# Patient Record
Sex: Female | Born: 1965 | Race: Black or African American | Hispanic: No | Marital: Married | Smoking: Current every day smoker
Health system: Southern US, Community
[De-identification: ages and names within clinical notes are randomized; demographics above are authoritative.]

## PROBLEM LIST (undated history)

## (undated) DIAGNOSIS — I10 Essential (primary) hypertension: Secondary | ICD-10-CM

## (undated) DIAGNOSIS — F329 Major depressive disorder, single episode, unspecified: Secondary | ICD-10-CM

## (undated) DIAGNOSIS — F419 Anxiety disorder, unspecified: Secondary | ICD-10-CM

## (undated) DIAGNOSIS — F32A Depression, unspecified: Secondary | ICD-10-CM

## (undated) HISTORY — DX: Major depressive disorder, single episode, unspecified: F32.9

## (undated) HISTORY — DX: Essential (primary) hypertension: I10

## (undated) HISTORY — DX: Depression, unspecified: F32.A

---

## 2001-04-16 HISTORY — PX: FRACTURE SURGERY: SHX138

## 2001-10-14 ENCOUNTER — Encounter: Payer: Self-pay | Admitting: Emergency Medicine

## 2001-10-14 ENCOUNTER — Emergency Department (HOSPITAL_COMMUNITY): Admission: EM | Admit: 2001-10-14 | Discharge: 2001-10-14 | Payer: Self-pay | Admitting: Emergency Medicine

## 2001-10-15 ENCOUNTER — Encounter: Admission: RE | Admit: 2001-10-15 | Discharge: 2001-10-15 | Payer: Self-pay | Admitting: Internal Medicine

## 2001-10-22 ENCOUNTER — Encounter: Admission: RE | Admit: 2001-10-22 | Discharge: 2001-10-22 | Payer: Self-pay | Admitting: Internal Medicine

## 2001-11-12 ENCOUNTER — Encounter: Admission: RE | Admit: 2001-11-12 | Discharge: 2001-11-12 | Payer: Self-pay | Admitting: Internal Medicine

## 2002-07-06 ENCOUNTER — Emergency Department (HOSPITAL_COMMUNITY): Admission: EM | Admit: 2002-07-06 | Discharge: 2002-07-06 | Payer: Self-pay | Admitting: *Deleted

## 2004-01-11 ENCOUNTER — Emergency Department (HOSPITAL_COMMUNITY): Admission: EM | Admit: 2004-01-11 | Discharge: 2004-01-11 | Payer: Self-pay | Admitting: Emergency Medicine

## 2004-10-09 ENCOUNTER — Emergency Department (HOSPITAL_COMMUNITY): Admission: EM | Admit: 2004-10-09 | Discharge: 2004-10-09 | Payer: Self-pay | Admitting: Emergency Medicine

## 2005-01-18 ENCOUNTER — Emergency Department (HOSPITAL_COMMUNITY): Admission: EM | Admit: 2005-01-18 | Discharge: 2005-01-18 | Payer: Self-pay | Admitting: Emergency Medicine

## 2005-03-03 ENCOUNTER — Emergency Department (HOSPITAL_COMMUNITY): Admission: EM | Admit: 2005-03-03 | Discharge: 2005-03-03 | Payer: Self-pay | Admitting: Emergency Medicine

## 2005-04-15 ENCOUNTER — Emergency Department (HOSPITAL_COMMUNITY): Admission: EM | Admit: 2005-04-15 | Discharge: 2005-04-15 | Payer: Self-pay | Admitting: Emergency Medicine

## 2006-04-14 ENCOUNTER — Emergency Department (HOSPITAL_COMMUNITY): Admission: EM | Admit: 2006-04-14 | Discharge: 2006-04-14 | Payer: Self-pay | Admitting: Emergency Medicine

## 2006-07-14 ENCOUNTER — Emergency Department (HOSPITAL_COMMUNITY): Admission: EM | Admit: 2006-07-14 | Discharge: 2006-07-14 | Payer: Self-pay | Admitting: Emergency Medicine

## 2007-01-01 ENCOUNTER — Emergency Department (HOSPITAL_COMMUNITY): Admission: EM | Admit: 2007-01-01 | Discharge: 2007-01-01 | Payer: Self-pay | Admitting: Emergency Medicine

## 2007-01-03 ENCOUNTER — Encounter (INDEPENDENT_AMBULATORY_CARE_PROVIDER_SITE_OTHER): Payer: Self-pay | Admitting: Internal Medicine

## 2007-01-08 ENCOUNTER — Emergency Department (HOSPITAL_COMMUNITY): Admission: EM | Admit: 2007-01-08 | Discharge: 2007-01-08 | Payer: Self-pay | Admitting: Emergency Medicine

## 2007-02-18 ENCOUNTER — Encounter: Admission: RE | Admit: 2007-02-18 | Discharge: 2007-02-18 | Payer: Self-pay | Admitting: *Deleted

## 2007-04-24 ENCOUNTER — Ambulatory Visit: Payer: Self-pay | Admitting: Internal Medicine

## 2007-04-24 DIAGNOSIS — IMO0002 Reserved for concepts with insufficient information to code with codable children: Secondary | ICD-10-CM

## 2007-04-24 DIAGNOSIS — R55 Syncope and collapse: Secondary | ICD-10-CM

## 2007-04-24 DIAGNOSIS — F1910 Other psychoactive substance abuse, uncomplicated: Secondary | ICD-10-CM

## 2007-04-24 DIAGNOSIS — F329 Major depressive disorder, single episode, unspecified: Secondary | ICD-10-CM

## 2007-04-24 DIAGNOSIS — F410 Panic disorder [episodic paroxysmal anxiety] without agoraphobia: Secondary | ICD-10-CM

## 2007-04-25 ENCOUNTER — Encounter (INDEPENDENT_AMBULATORY_CARE_PROVIDER_SITE_OTHER): Payer: Self-pay | Admitting: Internal Medicine

## 2007-05-15 ENCOUNTER — Ambulatory Visit: Payer: Self-pay | Admitting: Internal Medicine

## 2007-05-23 ENCOUNTER — Ambulatory Visit: Payer: Self-pay | Admitting: Internal Medicine

## 2007-05-23 ENCOUNTER — Ambulatory Visit (HOSPITAL_COMMUNITY): Admission: RE | Admit: 2007-05-23 | Discharge: 2007-05-23 | Payer: Self-pay | Admitting: Internal Medicine

## 2007-08-06 ENCOUNTER — Telehealth (INDEPENDENT_AMBULATORY_CARE_PROVIDER_SITE_OTHER): Payer: Self-pay | Admitting: Internal Medicine

## 2007-09-21 ENCOUNTER — Emergency Department (HOSPITAL_COMMUNITY): Admission: EM | Admit: 2007-09-21 | Discharge: 2007-09-21 | Payer: Self-pay | Admitting: Emergency Medicine

## 2007-10-28 ENCOUNTER — Telehealth (INDEPENDENT_AMBULATORY_CARE_PROVIDER_SITE_OTHER): Payer: Self-pay | Admitting: Internal Medicine

## 2007-12-04 ENCOUNTER — Ambulatory Visit: Payer: Self-pay | Admitting: Internal Medicine

## 2008-01-20 ENCOUNTER — Ambulatory Visit: Payer: Self-pay | Admitting: Internal Medicine

## 2008-07-06 ENCOUNTER — Encounter (INDEPENDENT_AMBULATORY_CARE_PROVIDER_SITE_OTHER): Payer: Self-pay | Admitting: Internal Medicine

## 2008-07-06 ENCOUNTER — Ambulatory Visit: Payer: Self-pay | Admitting: Internal Medicine

## 2008-07-06 DIAGNOSIS — A5901 Trichomonal vulvovaginitis: Secondary | ICD-10-CM | POA: Insufficient documentation

## 2008-07-06 DIAGNOSIS — F5089 Other specified eating disorder: Secondary | ICD-10-CM | POA: Insufficient documentation

## 2008-07-06 LAB — CONVERTED CEMR LAB
Bilirubin Urine: NEGATIVE
Blood in Urine, dipstick: NEGATIVE
Glucose, Urine, Semiquant: NEGATIVE
KOH Prep: NEGATIVE
Ketones, urine, test strip: NEGATIVE
Nitrite: NEGATIVE
Protein, U semiquant: NEGATIVE
Specific Gravity, Urine: <1.005
Urobilinogen, UA: 0.2
Whiff Test: POSITIVE
pH: 6.5

## 2008-07-08 ENCOUNTER — Ambulatory Visit (HOSPITAL_COMMUNITY): Admission: RE | Admit: 2008-07-08 | Discharge: 2008-07-08 | Payer: Self-pay | Admitting: Internal Medicine

## 2008-07-09 ENCOUNTER — Encounter (INDEPENDENT_AMBULATORY_CARE_PROVIDER_SITE_OTHER): Payer: Self-pay | Admitting: Internal Medicine

## 2008-07-09 LAB — CONVERTED CEMR LAB
ALT: 13 units/L (ref 0–35)
AST: 14 units/L (ref 0–37)
Albumin: 4.1 g/dL (ref 3.5–5.2)
Alkaline Phosphatase: 80 units/L (ref 39–117)
BUN: 4 mg/dL — ABNORMAL LOW (ref 6–23)
Basophils Absolute: 0.1 10*3/uL (ref 0.0–0.1)
Basophils Relative: 1 % (ref 0–1)
CO2: 26 meq/L (ref 19–32)
Calcium: 9.7 mg/dL (ref 8.4–10.5)
Chlamydia, DNA Probe: NEGATIVE
Chloride: 102 meq/L (ref 96–112)
Cholesterol: 137 mg/dL (ref 0–200)
Creatinine, Ser: 0.83 mg/dL (ref 0.40–1.20)
Eosinophils Absolute: 0.1 10*3/uL (ref 0.0–0.7)
Eosinophils Relative: 2 % (ref 0–5)
GC Probe Amp, Genital: NEGATIVE
Glucose, Bld: 86 mg/dL (ref 70–99)
HCT: 36.2 % (ref 36.0–46.0)
HDL: 52 mg/dL (ref >39)
Hemoglobin: 11.3 g/dL — ABNORMAL LOW (ref 12.0–15.0)
LDL Cholesterol: 66 mg/dL (ref 0–99)
Lymphocytes Relative: 38 % (ref 12–46)
Lymphs Abs: 2.9 10*3/uL (ref 0.7–4.0)
MCHC: 31.2 g/dL (ref 30.0–36.0)
MCV: 73.1 fL — ABNORMAL LOW (ref 78.0–100.0)
Monocytes Absolute: 0.6 10*3/uL (ref 0.1–1.0)
Monocytes Relative: 7 % (ref 3–12)
Neutro Abs: 3.9 10*3/uL (ref 1.7–7.7)
Neutrophils Relative %: 52 % (ref 43–77)
Platelets: 359 10*3/uL (ref 150–400)
Potassium: 4.4 meq/L (ref 3.5–5.3)
RBC: 4.95 M/uL (ref 3.87–5.11)
RDW: 17 % — ABNORMAL HIGH (ref 11.5–15.5)
Sodium: 138 meq/L (ref 135–145)
Total Bilirubin: 0.3 mg/dL (ref 0.3–1.2)
Total CHOL/HDL Ratio: 2.6
Total Protein: 7.3 g/dL (ref 6.0–8.3)
Triglycerides: 96 mg/dL (ref <150)
VLDL: 19 mg/dL (ref 0–40)
WBC: 7.5 10*3/uL (ref 4.0–10.5)

## 2008-07-12 ENCOUNTER — Ambulatory Visit: Payer: Self-pay | Admitting: Internal Medicine

## 2008-07-12 LAB — CONVERTED CEMR LAB: RBC Folate: 478 ng/mL (ref 180–600)

## 2008-07-14 DIAGNOSIS — D509 Iron deficiency anemia, unspecified: Secondary | ICD-10-CM

## 2008-07-14 LAB — CONVERTED CEMR LAB
Iron: 22 ug/dL — ABNORMAL LOW (ref 42–145)
Lead-Whole Blood: 2.6 ug/dL
Saturation Ratios: 4 % — ABNORMAL LOW (ref 20–55)
TIBC: 499 ug/dL — ABNORMAL HIGH (ref 250–470)
UIBC: 477 ug/dL
Vitamin B-12: 775 pg/mL (ref 211–911)

## 2008-07-22 ENCOUNTER — Emergency Department (HOSPITAL_COMMUNITY): Admission: EM | Admit: 2008-07-22 | Discharge: 2008-07-22 | Payer: Self-pay | Admitting: Emergency Medicine

## 2008-09-07 ENCOUNTER — Ambulatory Visit: Payer: Self-pay | Admitting: *Deleted

## 2008-09-24 IMAGING — US US ABDOMEN COMPLETE
1 series · 14 of 25 positions shown · non-contrast
Comparison: none

CLINICAL DATA: 40-year-old with vomiting and chest pain.  
 ABDOMEN ULTRASOUND:
TECHNIQUE: Complete abdominal ultrasound examination was performed including evaluation of the liver, gallbladder, bile ducts, pancreas, kidneys, spleen, IVC, and abdominal aorta.

[Series 1: unknown · 0.35mm/px · 14 of 69 slices shown]
[im 1/69]
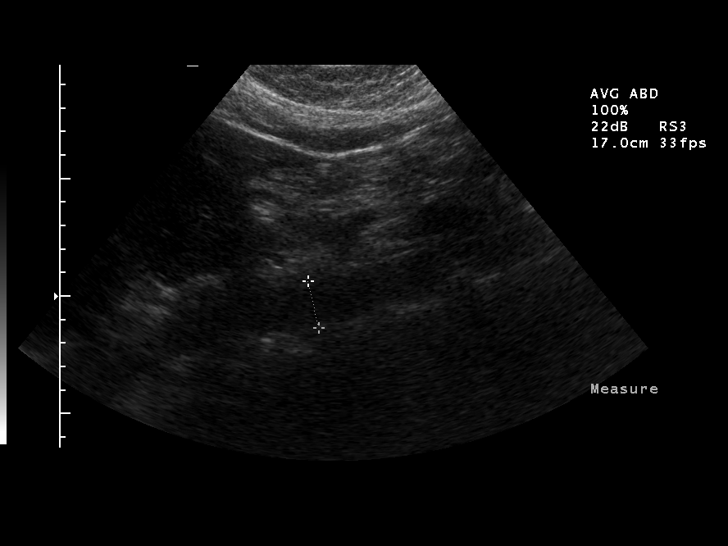
[im 6/69]
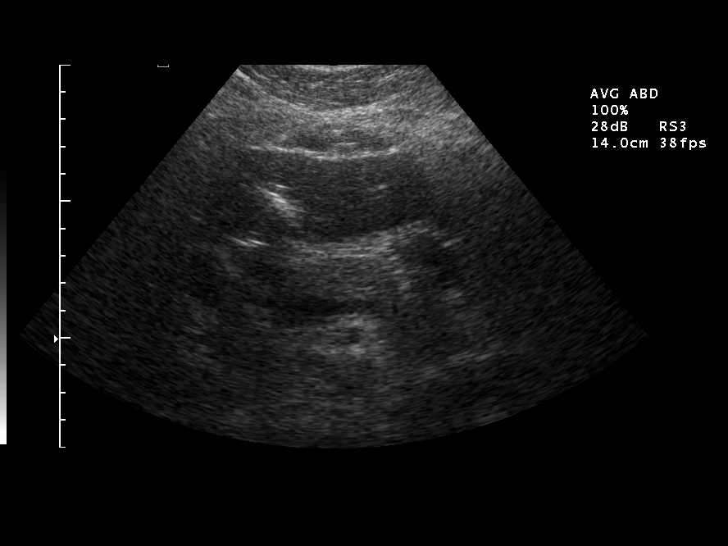
[im 12/69]
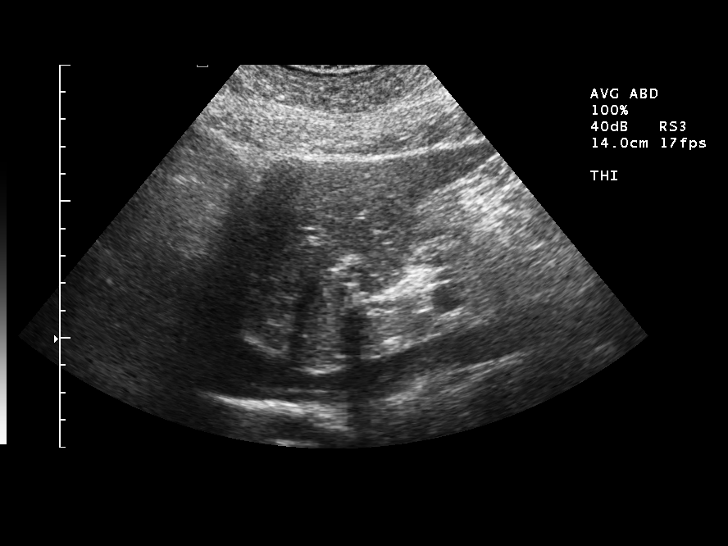
[im 18/69]
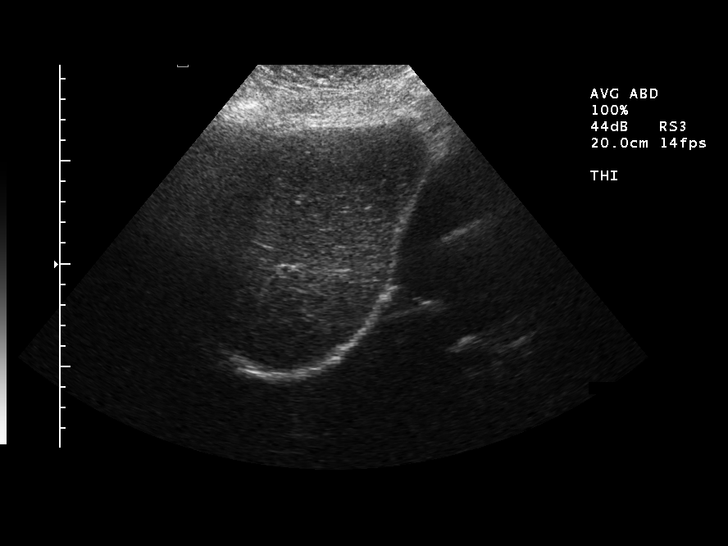
[im 23/69]
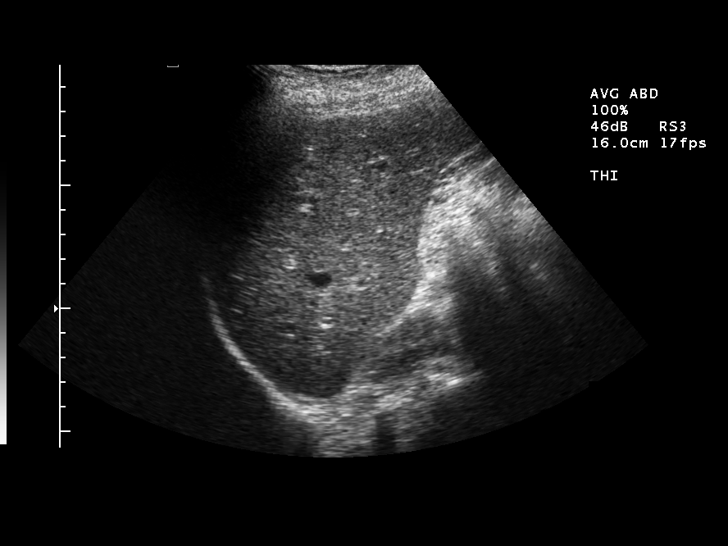
[im 26/69]
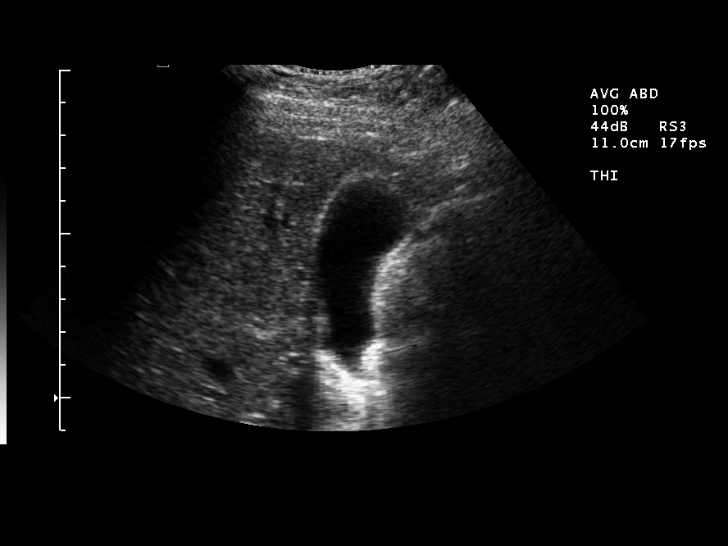
[im 32/69]
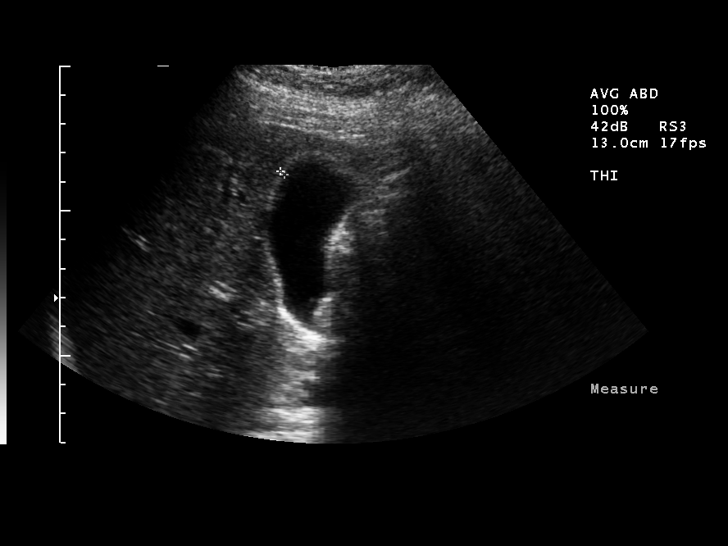
[im 37/69]
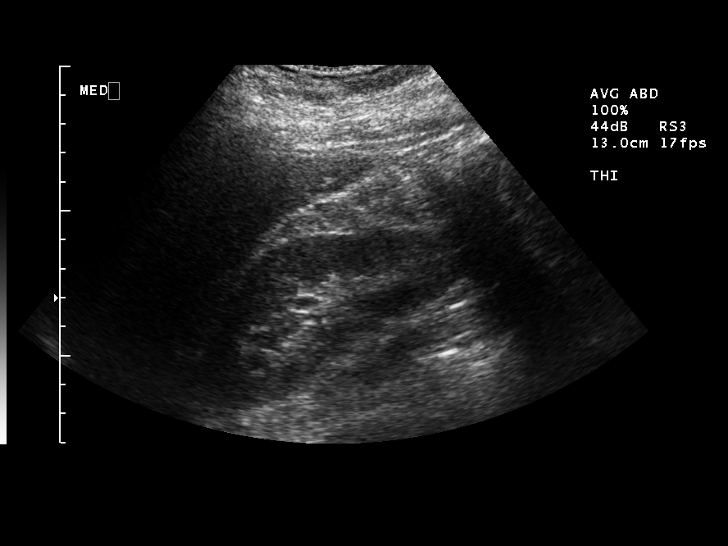
[im 43/69]
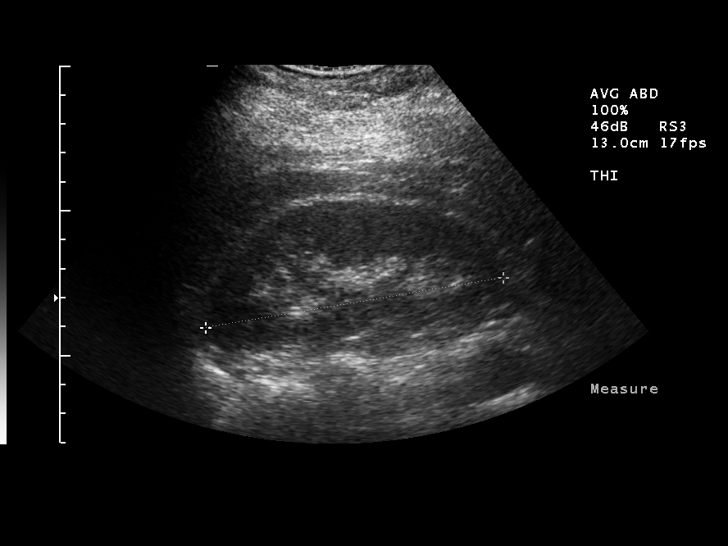
[im 46/69]
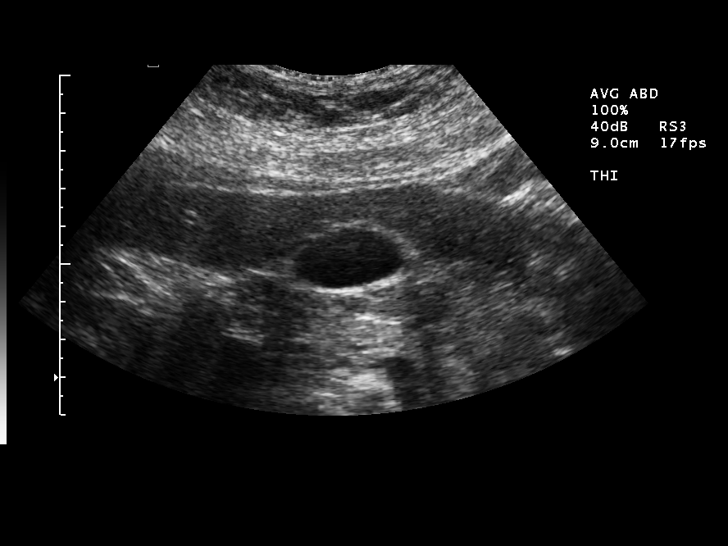
[im 52/69]
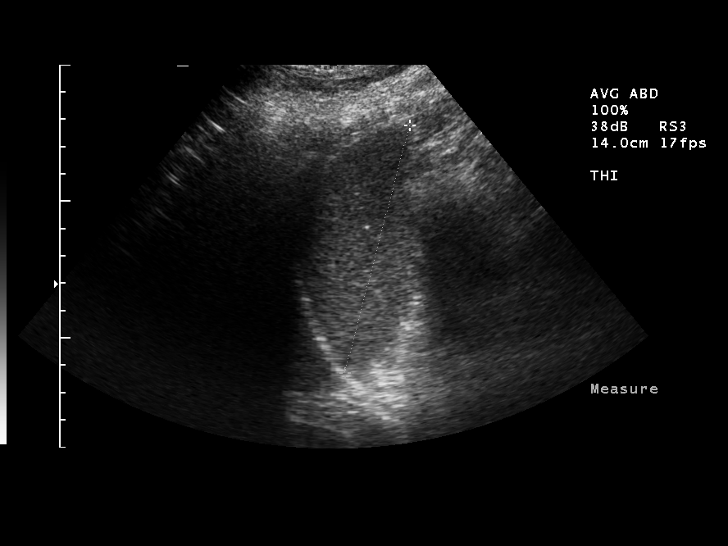
[im 57/69]
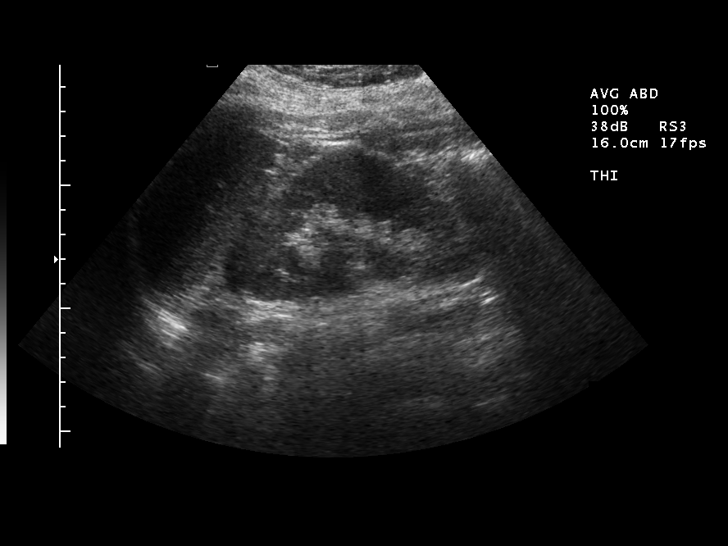
[im 63/69]
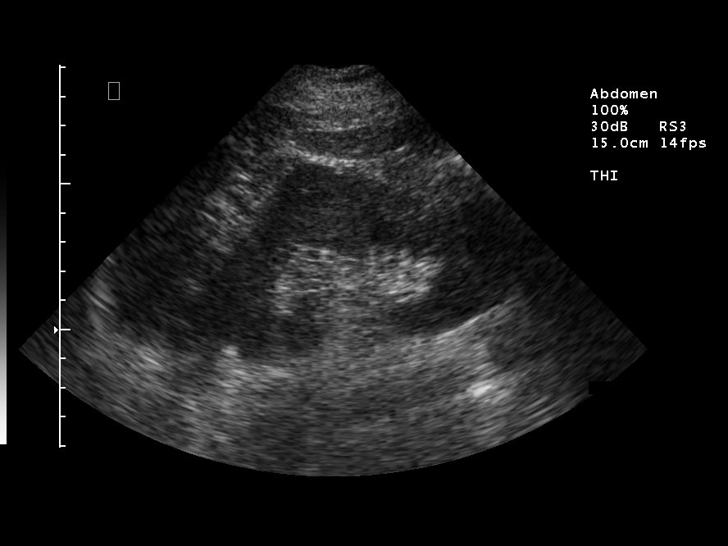
[im 69/69]
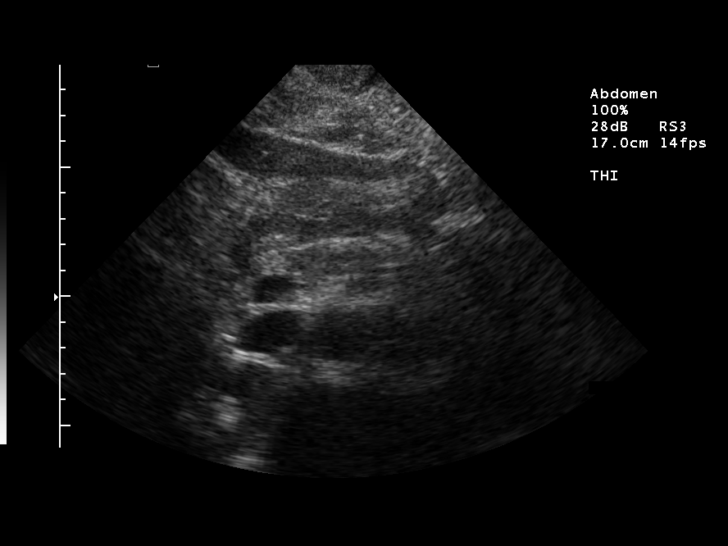

[14 of 25 positions shown; findings below may reference images not displayed]

FINDINGS: The gallbladder has a normal appearance.  The liver shows fatty infiltration but no focal abnormality.  The gallbladder and common bile duct have a normal appearance.  The pancreatic head and neck have a normal appearance, but the pancreatic tail is not well seen.  The spleen, kidneys, inferior vena cava and abdominal aorta have a normal appearance.
IMPRESSION: No evidence for acute abdominal abnormality.

## 2009-10-24 ENCOUNTER — Emergency Department (HOSPITAL_COMMUNITY): Admission: EM | Admit: 2009-10-24 | Discharge: 2009-10-24 | Payer: Self-pay | Admitting: Emergency Medicine

## 2010-07-02 LAB — CBC
HCT: 35.9 % — ABNORMAL LOW (ref 36.0–46.0)
Hemoglobin: 11.7 g/dL — ABNORMAL LOW (ref 12.0–15.0)
MCH: 25.3 pg — ABNORMAL LOW (ref 26.0–34.0)
MCHC: 32.6 g/dL (ref 30.0–36.0)
MCV: 77.4 fL — ABNORMAL LOW (ref 78.0–100.0)
Platelets: 276 10*3/uL (ref 150–400)
RBC: 4.64 MIL/uL (ref 3.87–5.11)
RDW: 17.9 % — ABNORMAL HIGH (ref 11.5–15.5)
WBC: 6.6 10*3/uL (ref 4.0–10.5)

## 2010-07-02 LAB — DIFFERENTIAL
Basophils Absolute: 0.1 10*3/uL (ref 0.0–0.1)
Basophils Relative: 2 % — ABNORMAL HIGH (ref 0–1)
Eosinophils Absolute: 0.2 10*3/uL (ref 0.0–0.7)
Eosinophils Relative: 3 % (ref 0–5)
Lymphocytes Relative: 44 % (ref 12–46)
Lymphs Abs: 2.9 10*3/uL (ref 0.7–4.0)
Monocytes Absolute: 0.4 10*3/uL (ref 0.1–1.0)
Monocytes Relative: 7 % (ref 3–12)
Neutro Abs: 3 10*3/uL (ref 1.7–7.7)
Neutrophils Relative %: 46 % (ref 43–77)

## 2010-07-02 LAB — URINALYSIS, ROUTINE W REFLEX MICROSCOPIC
Bilirubin Urine: NEGATIVE
Glucose, UA: NEGATIVE mg/dL
Ketones, ur: NEGATIVE mg/dL
Nitrite: NEGATIVE
Protein, ur: NEGATIVE mg/dL
Specific Gravity, Urine: 1.024 (ref 1.005–1.030)
Urobilinogen, UA: 0.2 mg/dL (ref 0.0–1.0)
pH: 6 (ref 5.0–8.0)

## 2010-07-02 LAB — COMPREHENSIVE METABOLIC PANEL
ALT: 12 U/L (ref 0–35)
AST: 15 U/L (ref 0–37)
Albumin: 3.5 g/dL (ref 3.5–5.2)
Alkaline Phosphatase: 61 U/L (ref 39–117)
BUN: 9 mg/dL (ref 6–23)
CO2: 26 mEq/L (ref 19–32)
Calcium: 9.3 mg/dL (ref 8.4–10.5)
Chloride: 108 mEq/L (ref 96–112)
Creatinine, Ser: 0.75 mg/dL (ref 0.4–1.2)
GFR calc Af Amer: >60 mL/min (ref >60)
GFR calc non Af Amer: >60 mL/min (ref >60)
Glucose, Bld: 86 mg/dL (ref 70–99)
Potassium: 3.9 mEq/L (ref 3.5–5.1)
Sodium: 140 mEq/L (ref 135–145)
Total Bilirubin: 0.2 mg/dL — ABNORMAL LOW (ref 0.3–1.2)
Total Protein: 6.9 g/dL (ref 6.0–8.3)

## 2010-07-02 LAB — URINE MICROSCOPIC-ADD ON

## 2010-07-02 LAB — LIPASE, BLOOD: Lipase: 23 U/L (ref 11–59)

## 2010-07-02 LAB — PROTIME-INR
INR: 1.03 (ref 0.00–1.49)
Prothrombin Time: 13.4 seconds (ref 11.6–15.2)

## 2010-07-02 LAB — APTT: aPTT: 29 seconds (ref 24–37)

## 2010-07-02 LAB — POCT PREGNANCY, URINE: Preg Test, Ur: NEGATIVE

## 2010-08-29 NOTE — Op Note (Signed)
Elizabeth Whitney, Elizabeth Whitney              ACCOUNT NO.:  [id number]   MEDICAL RECORD NO.:  [id number]          PATIENT TYPE:  OIB   LOCATION:  2899                         FACILITY:  [facility]   PHYSICIAN:  Doylene Canning. Ladona Ridgel, MD    DATE OF BIRTH:  1966-03-28   DATE OF PROCEDURE:  05/23/2007  DATE OF DISCHARGE:  05/23/2007                               OPERATIVE REPORT   ELECTROPHYSIOLOGIC PROCEDURE NOTE   PROCEDURE PERFORMED:  Head up tilt table testing.   INDICATION:  Unexplained syncope (recurrent).   1. INTRODUCTION.  The patient is a 45 year old woman with a history of      recurrent syncope of unexplained etiology.  She has undergone      extensive neurologic evaluation.  Her spells are associated with      urinary incontinence.  She has otherwise normal cardiovascular      function.  She has a history of polysubstance abuse with marijuana.      She is now referred for head-up tilt table testing.   1. PROCEDURE.  After informed consent was obtained, the patient was      taken to the Diagnostic EP Lab in a fasting state.  After the usual      preparation, she was placed in the supine position.  The initial      blood pressure was in the 150-160 range and the heart rate was in      the low 60s.  The patient was placed in the head-up tilt table      position.  Her blood pressure initially dropped in the 130-140      range.  Her heart rate remained in the 70s.  Over the course of the      next 30 minutes she was maintained in the upright position and her      blood pressure, which was initially in the 150-160 range, gradually      decreased down into the 130-140 range.  Her heart rate increased up      into the low 80s.  She had no symptoms of syncope or near syncope.      She was subsequently placed back in the supine position and was      started on isoproterenol at 1 mcg/min.  With concomitant increase      in heart rate, she was placed back up in the head-up tilt table      position and  her heart rate increased into the 110-120 range.      During isoproterenol infusion the heart rate would spontaneously      drop from the 130s down into the 80-90 range.  Despite this, there      was no change in the blood pressure and she was minimally      symptomatic with occasional nausea but otherwise no symptoms.      After 15 minutes on isoproterenol in the 70 degree head-up tilt      table position, she was placed back in the supine position and      returned to her room in satisfactory condition.  1. COMPLICATIONS.  There were no immediate procedure complications.   1. RESULTS.  This demonstrates a negative head-up tilt table test in a      patient with recurrent unexplained syncope.      Doylene Canning. Ladona Ridgel, MD  Electronically Signed     GWT/MEDQ  D:  05/23/2007  T:  05/25/2007  Job:  [id number]   cc:   Pricilla Riffle, MD, [facility]  Bevelyn Buckles. Nash Shearer, M.D.

## 2011-01-11 LAB — COMPREHENSIVE METABOLIC PANEL
Alkaline Phosphatase: 80
BUN: 7
CO2: 29
Calcium: 9.2
GFR calc non Af Amer: >60
Glucose, Bld: 118 — ABNORMAL HIGH
Total Protein: 7.3

## 2011-01-11 LAB — CBC
HCT: 40.3
Hemoglobin: 13.3
MCHC: 33
RBC: 5.15 — ABNORMAL HIGH
RDW: 17.3 — ABNORMAL HIGH

## 2011-01-11 LAB — URINALYSIS, ROUTINE W REFLEX MICROSCOPIC
Bilirubin Urine: NEGATIVE
Glucose, UA: NEGATIVE
Hgb urine dipstick: NEGATIVE
Ketones, ur: NEGATIVE
Nitrite: NEGATIVE
Protein, ur: 100 — AB
Specific Gravity, Urine: 1.036 — ABNORMAL HIGH
Urobilinogen, UA: 0.2
pH: 8

## 2011-01-11 LAB — DIFFERENTIAL
Basophils Relative: 0
Eosinophils Absolute: 0
Lymphs Abs: 0.8
Monocytes Relative: 1 — ABNORMAL LOW
Neutro Abs: 7.4
Neutrophils Relative %: 89 — ABNORMAL HIGH

## 2011-01-11 LAB — LIPASE, BLOOD: Lipase: 14

## 2011-01-25 LAB — CBC
HCT: 38.2
Hemoglobin: 12.5
MCV: 78.1
Platelets: 217
RDW: 17.4 — ABNORMAL HIGH

## 2011-01-25 LAB — URINALYSIS, ROUTINE W REFLEX MICROSCOPIC
Bilirubin Urine: NEGATIVE
Glucose, UA: NEGATIVE
Hgb urine dipstick: NEGATIVE
Ketones, ur: NEGATIVE
Protein, ur: NEGATIVE
Urobilinogen, UA: 0.2

## 2011-01-25 LAB — COMPREHENSIVE METABOLIC PANEL
Alkaline Phosphatase: 72
BUN: 5 — ABNORMAL LOW
Creatinine, Ser: 0.68
Glucose, Bld: 105 — ABNORMAL HIGH
Potassium: 3.6
Total Bilirubin: 0.6
Total Protein: 7.1

## 2011-01-25 LAB — DIFFERENTIAL
Basophils Absolute: 0
Basophils Relative: 1
Lymphocytes Relative: 26
Monocytes Relative: 6
Neutro Abs: 4.6
Neutrophils Relative %: 66

## 2011-01-25 LAB — URINE MICROSCOPIC-ADD ON

## 2011-01-25 LAB — I-STAT 8, (EC8 V) (CONVERTED LAB)
Acid-Base Excess: 2
BUN: 7
Bicarbonate: 28.1 — ABNORMAL HIGH
HCT: 41
Hemoglobin: 13.9
Operator id: 288331
Sodium: 140
TCO2: 30
pCO2, Ven: 50.8 — ABNORMAL HIGH

## 2011-01-25 LAB — RAPID URINE DRUG SCREEN, HOSP PERFORMED
Amphetamines: NOT DETECTED
Barbiturates: NOT DETECTED
Benzodiazepines: NOT DETECTED
Cocaine: POSITIVE — AB
Opiates: NOT DETECTED

## 2011-01-25 LAB — POCT CARDIAC MARKERS
CKMB, poc: <1 — ABNORMAL LOW
Myoglobin, poc: 43
Troponin i, poc: <0.05

## 2011-01-25 LAB — POCT PREGNANCY, URINE: Operator id: 19830

## 2011-02-18 ENCOUNTER — Emergency Department (HOSPITAL_COMMUNITY)
Admission: EM | Admit: 2011-02-18 | Discharge: 2011-02-19 | Disposition: A | Discharge status: Home / Self Care | Payer: PRIVATE HEALTH INSURANCE | Attending: Emergency Medicine | Admitting: Emergency Medicine

## 2011-02-18 ENCOUNTER — Emergency Department (HOSPITAL_COMMUNITY): Admission: EM | Admit: 2011-02-18 | Discharge: 2011-02-18 | Discharge status: Against Medical Advice | Payer: Self-pay

## 2011-02-18 ENCOUNTER — Other Ambulatory Visit: Payer: Self-pay

## 2011-02-18 ENCOUNTER — Encounter: Payer: Self-pay | Admitting: Emergency Medicine

## 2011-02-18 DIAGNOSIS — F17200 Nicotine dependence, unspecified, uncomplicated: Secondary | ICD-10-CM | POA: Insufficient documentation

## 2011-02-18 DIAGNOSIS — R0781 Pleurodynia: Secondary | ICD-10-CM

## 2011-02-18 DIAGNOSIS — R079 Chest pain, unspecified: Secondary | ICD-10-CM | POA: Insufficient documentation

## 2011-02-18 DIAGNOSIS — R071 Chest pain on breathing: Secondary | ICD-10-CM | POA: Insufficient documentation

## 2011-02-18 NOTE — ED Notes (Signed)
Pt was sitting at rest and began having lt side chest pain that radates to lt arm no sob, no n/v states that it has been going on for 1 hour now,

## 2011-02-19 ENCOUNTER — Emergency Department (HOSPITAL_COMMUNITY): Payer: PRIVATE HEALTH INSURANCE

## 2011-02-19 LAB — DIFFERENTIAL
Basophils Relative: 2 % — ABNORMAL HIGH (ref 0–1)
Eosinophils Absolute: 0.3 10*3/uL (ref 0.0–0.7)
Eosinophils Relative: 5 % (ref 0–5)
Monocytes Absolute: 0.5 10*3/uL (ref 0.1–1.0)
Monocytes Relative: 10 % (ref 3–12)
Neutro Abs: 1.9 10*3/uL (ref 1.7–7.7)

## 2011-02-19 LAB — CBC
MCH: 25.7 pg — ABNORMAL LOW (ref 26.0–34.0)
MCHC: 31.9 g/dL (ref 30.0–36.0)
MCV: 80.7 fL (ref 78.0–100.0)
Platelets: 225 10*3/uL (ref 150–400)
RDW: 15.8 % — ABNORMAL HIGH (ref 11.5–15.5)

## 2011-02-19 LAB — BASIC METABOLIC PANEL
BUN: 12 mg/dL (ref 6–23)
Calcium: 9 mg/dL (ref 8.4–10.5)
GFR calc Af Amer: >90 mL/min (ref >90)
GFR calc non Af Amer: >90 mL/min (ref >90)
Glucose, Bld: 96 mg/dL (ref 70–99)

## 2011-02-19 MED ORDER — KETOROLAC TROMETHAMINE 60 MG/2ML IM SOLN
INTRAMUSCULAR | Status: AC
  Administered 2011-02-19: 30 mg
  Filled 2011-02-19: qty 2

## 2011-02-19 MED ORDER — KETOROLAC TROMETHAMINE 30 MG/ML IJ SOLN: 30.0000 mg | Freq: Once | INTRAMUSCULAR | Status: DC

## 2011-02-19 MED ORDER — IBUPROFEN 600 MG PO TABS: 600.0000 mg | ORAL_TABLET | Freq: Four times a day (QID) | ORAL | Status: AC | PRN

## 2011-02-19 NOTE — ED Provider Notes (Signed)
History     CSN: [id number] Arrival date & time: 02/18/2011 11:10 PM   First MD Initiated Contact with Patient 02/19/11 0359      Chief Complaint  Patient presents with  . Chest Pain    (Consider location/radiation/quality/duration/timing/severity/associated sxs/prior treatment) HPI Comments: Patient had onset of sharp and shooting left arm pain which occurred while at rest on the couch, then developed left upper chest pain of similar quality. This becomes worse when she breathes faster though she denies cough, shortness of breath, fever. She has no swelling of her legs no recent travel no recent trauma and no oral contraceptive pills. She does smoke.  He has a mother who had a heart attack at the age of 36 but she herself has no other cardiac or pulmonary history.  Patient is a 45 y.o. female presenting with chest pain. The history is provided by the patient.  Chest Pain Episode onset: 5 hours ago. Duration of episode(s) is 5 hours. Chest pain occurs constantly. Progression since onset: Waxing and waning. The pain is associated with breathing and exertion. The severity of the pain is mild. The quality of the pain is described as stabbing and sharp. Radiates to: Left chest 2 left arm. Chest pain is worsened by deep breathing and exertion. Pertinent negatives for primary symptoms include no fever, no fatigue, no syncope, no shortness of breath, no cough, no wheezing, no palpitations, no abdominal pain, no nausea, no vomiting and no altered mental status.     No past medical history on file.  No past surgical history on file.  No family history on file.  History  Substance Use Topics  . Smoking status: Current Everyday Smoker -- 1.0 packs/day  . Smokeless tobacco: Not on file  . Alcohol Use: Yes    OB History    Grav Para Term Preterm Abortions TAB SAB Ect Mult Living                  Review of Systems  Constitutional: Negative for fever and fatigue.  Respiratory: Negative  for cough, shortness of breath and wheezing.   Cardiovascular: Positive for chest pain. Negative for palpitations and syncope.  Gastrointestinal: Negative for nausea, vomiting and abdominal pain.  Psychiatric/Behavioral: Negative for altered mental status.  All other systems reviewed and are negative.    Allergies  Review of patient's allergies indicates no known allergies.  Home Medications   Current Outpatient Rx  Name Route Sig Dispense Refill  . DIPHENHYDRAMINE HCL 25 MG PO CAPS Oral Take 25 mg by mouth every 6 (six) hours as needed. Allergies     . IBUPROFEN 600 MG PO TABS Oral Take 1 tablet (600 mg total) by mouth every 6 (six) hours as needed for pain. 30 tablet 0    BP 98/79  Pulse 61  Temp(Src) 98.4 F (36.9 C) (Oral)  Resp 19  SpO2 100%  Physical Exam  Nursing note and vitals reviewed. Constitutional: She appears well-developed and well-nourished. No distress.  HENT:  Head: Normocephalic and atraumatic.  Mouth/Throat: Oropharynx is clear and moist. No oropharyngeal exudate.  Eyes: Conjunctivae and EOM are normal. Pupils are equal, round, and reactive to light. Right eye exhibits no discharge. Left eye exhibits no discharge. No scleral icterus.  Neck: Normal range of motion. Neck supple. No JVD present. No thyromegaly present.  Cardiovascular: Normal rate, regular rhythm, normal heart sounds and intact distal pulses.  Exam reveals no gallop and no friction rub.   No murmur heard. Pulmonary/Chest:  Effort normal and breath sounds normal. No respiratory distress. She has no wheezes. She has no rales.  Abdominal: Soft. Bowel sounds are normal. She exhibits no distension and no mass. There is no tenderness.  Musculoskeletal: Normal range of motion. She exhibits no edema and no tenderness.  Lymphadenopathy:    She has no cervical adenopathy.  Neurological: She is alert. Coordination normal.  Skin: Skin is warm and dry. No rash noted. No erythema.  Psychiatric: She has a  normal mood and affect. Her behavior is normal.    ED Course  Procedures (including critical care time)  Labs Reviewed  BASIC METABOLIC PANEL - Abnormal; Notable for the following:    Potassium 5.3 (*) MODERATE HEMOLYSIS   All other components within normal limits  CBC - Abnormal; Notable for the following:    Hemoglobin 11.6 (*)    MCH 25.7 (*)    RDW 15.8 (*)    All other components within normal limits  DIFFERENTIAL - Abnormal; Notable for the following:    Neutrophils Relative 41 (*)    Basophils Relative 2 (*)    All other components within normal limits  TROPONIN I  D-DIMER, QUANTITATIVE   Dg Chest 2 View  02/19/2011  *RADIOLOGY REPORT*  Clinical Data: Left chest pain, shortness of breath.  CHEST - 2 VIEW  Comparison: 01/18/2005  Findings: Heart size upper normal limits.  No focal consolidation. No pleural effusion or pneumothorax.  Mediastinal contours within normal limits.  No acute osseous abnormality. Multilevel osteophyte formation.  IMPRESSION: No acute cardiopulmonary process identified.  Original Report Authenticated By: Waneta Martins, M.D.     1. Pleuritic chest pain       MDM  No reproducible tenderness on my exam, very low risk for cardiac disease especially in the presence of a normal EKG with ongoing mild chest pain. Will rule out and obtain a d-dimer to rule out pulmonary embolism. Currently oxygen saturation 100% pulse 71 and a normal blood pressure.  ED ECG REPORT   Date: 02/18/11  Rate: 66  Rhythm: normal sinus rhythm  QRS Axis: normal  Intervals: normal  ST/T Wave abnormalities: normal  Conduction Disutrbances:none  Narrative Interpretation:   Old EKG Reviewed: none available   Laboratory evaluation reveals no significant acute findings. Her potassium level was slightly elevated at 5.3 however there are no signs of changes on the EKG. This is something that can be followed up by her family doctor. She has improved with the medications given  and has no symptoms at this time and I have informed her of her results. There is a very low suspicion that this is related to a cardiac source and she has no signs of PE with a negative d-dimer.      Vida Roller, MD 02/19/11 [phone]

## 2011-02-19 NOTE — ED Notes (Signed)
Patient is resting comfortably. 

## 2011-02-19 NOTE — ED Notes (Signed)
Vital signs stable. 

## 2011-08-22 ENCOUNTER — Encounter (HOSPITAL_COMMUNITY): Payer: Self-pay | Admitting: *Deleted

## 2011-08-22 ENCOUNTER — Emergency Department (HOSPITAL_COMMUNITY)
Admission: EM | Admit: 2011-08-22 | Discharge: 2011-08-22 | Disposition: A | Discharge status: Home / Self Care | Payer: No Typology Code available for payment source | Attending: Emergency Medicine | Admitting: Emergency Medicine

## 2011-08-22 DIAGNOSIS — F17200 Nicotine dependence, unspecified, uncomplicated: Secondary | ICD-10-CM | POA: Insufficient documentation

## 2011-08-22 DIAGNOSIS — X58XXXA Exposure to other specified factors, initial encounter: Secondary | ICD-10-CM | POA: Insufficient documentation

## 2011-08-22 DIAGNOSIS — S161XXA Strain of muscle, fascia and tendon at neck level, initial encounter: Secondary | ICD-10-CM

## 2011-08-22 DIAGNOSIS — M542 Cervicalgia: Secondary | ICD-10-CM | POA: Insufficient documentation

## 2011-08-22 DIAGNOSIS — S139XXA Sprain of joints and ligaments of unspecified parts of neck, initial encounter: Secondary | ICD-10-CM | POA: Insufficient documentation

## 2011-08-22 MED ORDER — ACETAMINOPHEN-CODEINE #3 300-30 MG PO TABS: 1.0000 | ORAL_TABLET | Freq: Four times a day (QID) | ORAL | Status: AC | PRN

## 2011-08-22 MED ORDER — KETOROLAC TROMETHAMINE 30 MG/ML IJ SOLN
30.0000 mg | Freq: Once | INTRAMUSCULAR | Status: AC
  Administered 2011-08-22: 30 mg via INTRAMUSCULAR
  Filled 2011-08-22: qty 1

## 2011-08-22 MED ORDER — CYCLOBENZAPRINE HCL 5 MG PO TABS: 5.0000 mg | ORAL_TABLET | Freq: Three times a day (TID) | ORAL | Status: AC | PRN

## 2011-08-22 NOTE — ED Provider Notes (Addendum)
History   This chart was scribed for Elizabeth Whitney. Oletta Lamas, MD by Clarita Crane. The patient was seen in room STRE8/STRE8. Patient's care was started at 1628.    CSN: [id number]  Arrival date & time 08/22/11  1628   First MD Initiated Contact with Patient 08/22/11 1636      Chief Complaint  Patient presents with  . Neck Pain    (Consider location/radiation/quality/duration/timing/severity/associated sxs/prior treatment) HPI Elizabeth Whitney is a 46 y.o. female who presents to the Emergency Department complaining of constant moderate right sided neck pain onset 5 days ago upon awaking and persistent since. States pain will occasionally become sharp and severe. Patient notes Ibuprofen provided mild temporary relief. States pain is not aggravated with movement of neck but is aggravated with movement of right shoulder. Denies recent injury/trauma, fever, chills, congestion, rhinorrhea, sinus pressure, HA, difficulty swallowing. Patient is a current smoker.   History reviewed. No pertinent past medical history.  History reviewed. No pertinent past surgical history.  History reviewed. No pertinent family history.  History  Substance Use Topics  . Smoking status: Current Everyday Smoker -- 1.0 packs/day  . Smokeless tobacco: Not on file  . Alcohol Use: Yes    OB History    Grav Para Term Preterm Abortions TAB SAB Ect Mult Living                  Review of Systems  Constitutional: Negative for fever, chills, diaphoresis and activity change.  HENT: Positive for neck pain. Negative for sore throat, trouble swallowing, neck stiffness, postnasal drip and sinus pressure.   Respiratory: Negative for cough, shortness of breath and wheezing.   Cardiovascular: Negative for chest pain.  Gastrointestinal: Negative for nausea and vomiting.  Musculoskeletal: Negative for back pain.  Skin: Negative for rash.  Neurological: Negative for weakness.    Allergies  Review of patient's allergies  indicates no known allergies.  Home Medications   Current Outpatient Rx  Name Route Sig Dispense Refill  . DIPHENHYDRAMINE HCL 25 MG PO CAPS Oral Take 25 mg by mouth every 6 (six) hours as needed. Allergies     . IBUPROFEN 200 MG PO TABS Oral Take 200 mg by mouth every 6 (six) hours as needed. For pain    . ACETAMINOPHEN-CODEINE #3 300-30 MG PO TABS Oral Take 1-2 tablets by mouth every 6 (six) hours as needed for pain. 15 tablet 0  . CYCLOBENZAPRINE HCL 5 MG PO TABS Oral Take 1 tablet (5 mg total) by mouth 3 (three) times daily as needed for muscle spasms. 20 tablet 0    BP 162/78  Pulse 69  Temp(Src) 98.2 F (36.8 C) (Oral)  Resp 16  SpO2 100%  Physical Exam  Nursing note and vitals reviewed. Constitutional: She is oriented to person, place, and time. She appears well-developed and well-nourished. No distress.  HENT:  Head: Normocephalic and atraumatic.  Mouth/Throat: Oropharynx is clear and moist.  Eyes: EOM are normal.  Neck: Trachea normal and normal range of motion. Neck supple. Muscular tenderness present. No spinous process tenderness present. No rigidity. No tracheal deviation, no edema, no erythema and normal range of motion present. No thyromegaly present.       No meningismus. Neck pain with ROM of right shoulder.   Cardiovascular: Normal rate and intact distal pulses.   Pulmonary/Chest: Effort normal. No respiratory distress. She has no decreased breath sounds. She has no wheezes. She has no rhonchi.  Musculoskeletal: Normal range of motion.  Lymphadenopathy:  She has no cervical adenopathy.  Neurological: She is alert and oriented to person, place, and time.  Skin: Skin is warm and dry. No rash noted.  Psychiatric: She has a normal mood and affect. Her behavior is normal.    ED Course  Procedures (including critical care time)  DIAGNOSTIC STUDIES: Oxygen Saturation is 100% on room air, normal by my interpretation.    COORDINATION OF CARE: 4:44PM-Patient  informed of current plan for treatment and evaluation and agrees with plan at this time.     Labs Reviewed - No data to display No results found.   1. Cervical strain       MDM  I personally performed the services described in this documentation, which was scribed in my presence. The recorded information has been reviewed and considered.     Pt is reassured, given Rx for flexeril and codeine, encouarged to continue NSAIDs at home.  No meningismus, rash, HA, SOB.  Likely cervical strain.      Elizabeth Whitney. Oletta Lamas, MD 08/22/11 1702  Elizabeth Whitney. Shadi Larner, MD 08/22/11 1610

## 2011-08-22 NOTE — ED Notes (Signed)
Pt reports right sided neck pain since Friday, denies injury, reports no relief at home with OTC, reports increase in discomfort with certain movements.

## 2011-08-22 NOTE — Discharge Instructions (Signed)
Cervical Sprain A cervical sprain is an injury in the neck in which the ligaments are stretched or torn. The ligaments are the tissues that hold the bones of the neck (vertebrae) in place.Cervical sprains can range from very mild to very severe. Most cervical sprains get better in 1 to 3 weeks, but it depends on the cause and extent of the injury. Severe cervical sprains can cause the neck vertebrae to be unstable. This can lead to damage of the spinal cord and can result in serious nervous system problems. Your caregiver will determine whether your cervical sprain is mild or severe. CAUSES  Severe cervical sprains may be caused by:  Contact sport injuries (football, rugby, wrestling, hockey, auto racing, gymnastics, diving, martial arts, boxing).   Motor vehicle collisions.   Whiplash injuries. This means the neck is forcefully whipped backward and forward.   Falls.  Mild cervical sprains may be caused by:   Awkward positions, such as cradling a telephone between your ear and shoulder.   Sitting in a chair that does not offer proper support.   Working at a poorly designed computer station.   Activities that require looking up or down for long periods of time.  SYMPTOMS   Pain, soreness, stiffness, or a burning sensation in the front, back, or sides of the neck. This discomfort may develop immediately after injury or it may develop slowly and not begin for 24 hours or more after an injury.   Pain or tenderness directly in the middle of the back of the neck.   Shoulder or upper back pain.   Limited ability to move the neck.   Headache.   Dizziness.   Weakness, numbness, or tingling in the hands or arms.   Muscle spasms.   Difficulty swallowing or chewing.   Tenderness and swelling of the neck.  DIAGNOSIS  Most of the time, your caregiver can diagnose this problem by taking your history and doing a physical exam. Your caregiver will ask about any known problems, such as  arthritis in the neck or a previous neck injury. X-rays may be taken to find out if there are any other problems, such as problems with the bones of the neck. However, an X-ray often does not reveal the full extent of a cervical sprain. Other tests such as a computed tomography (CT) scan or magnetic resonance imaging (MRI) may be needed. TREATMENT  Treatment depends on the severity of the cervical sprain. Mild sprains can be treated with rest, keeping the neck in place (immobilization), and pain medicines. Severe cervical sprains need immediate immobilization and an appointment with an orthopedist or neurosurgeon. Several treatment options are available to help with pain, muscle spasms, and other symptoms. Your caregiver may prescribe:  Medicines, such as pain relievers, numbing medicines, or muscle relaxants.   Physical therapy. This can include stretching exercises, strengthening exercises, and posture training. Exercises and improved posture can help stabilize the neck, strengthen muscles, and help stop symptoms from returning.   A neck collar to be worn for short periods of time. Often, these collars are worn for comfort. However, certain collars may be worn to protect the neck and prevent further worsening of a serious cervical sprain.  HOME CARE INSTRUCTIONS   Put ice on the injured area.   Put ice in a plastic bag.   Place a towel between your skin and the bag.   Leave the ice on for 15 to 20 minutes, 3 to 4 times a day.     Only take over-the-counter or prescription medicines for pain, discomfort, or fever as directed by your caregiver.   Keep all follow-up appointments as directed by your caregiver.   Keep all physical therapy appointments as directed by your caregiver.   If a neck collar is prescribed, wear it as directed by your caregiver.   Do not drive while wearing a neck collar.   Make any needed adjustments to your work station to promote good posture.   Avoid positions  and activities that make your symptoms worse.   Warm up and stretch before being active to help prevent problems.  SEEK MEDICAL CARE IF:   Your pain is not controlled with medicine.   You are unable to decrease your pain medicine over time as planned.   Your activity level is not improving as expected.  SEEK IMMEDIATE MEDICAL CARE IF:   You develop any bleeding, stomach upset, or signs of an allergic reaction to your medicine.   Your symptoms get worse.   You develop new, unexplained symptoms.   You have numbness, tingling, weakness, or paralysis in any part of your body.  MAKE SURE YOU:   Understand these instructions.   Will watch your condition.   Will get help right away if you are not doing well or get worse.  Document Released: 01/28/2007 Document Revised: 03/22/2011 Document Reviewed: 01/03/2011 [employer] Patient Information 2012 [employer].    Narcotic and benzodiazepine use may cause drowsiness, slowed breathing or dependence.  Please use with caution and do not drive, operate machinery or watch young children alone while taking them.  Taking combinations of these medications or drinking alcohol will potentiate these effects.    

## 2012-08-28 ENCOUNTER — Emergency Department (HOSPITAL_COMMUNITY)
Admission: EM | Admit: 2012-08-28 | Discharge: 2012-08-28 | Disposition: A | Discharge status: Home / Self Care | Payer: Self-pay | Attending: Emergency Medicine | Admitting: Emergency Medicine

## 2012-08-28 ENCOUNTER — Encounter (HOSPITAL_COMMUNITY): Payer: Self-pay

## 2012-08-28 ENCOUNTER — Emergency Department (HOSPITAL_COMMUNITY): Payer: Self-pay

## 2012-08-28 DIAGNOSIS — R071 Chest pain on breathing: Secondary | ICD-10-CM | POA: Insufficient documentation

## 2012-08-28 DIAGNOSIS — F17200 Nicotine dependence, unspecified, uncomplicated: Secondary | ICD-10-CM | POA: Insufficient documentation

## 2012-08-28 DIAGNOSIS — R0789 Other chest pain: Secondary | ICD-10-CM

## 2012-08-28 MED ORDER — MELOXICAM 15 MG PO TABS: 15.0000 mg | ORAL_TABLET | Freq: Every day | ORAL | Status: DC

## 2012-08-28 NOTE — ED Notes (Signed)
Pt. Woke up at 0300am with rt. Side pressure denies any sob or n/v at that time.  Pt. Denies any diphoresis.  She went to work and as she used her rt. Arm the rt. Side chest pain intensifies and she describes it as her rt. Arm and rt. Chest area is sore.  She felt nauseated at work.   Presently she denies any n/v/ or sob.  She is having rt. Arm and chest soreness. Skin is warm and dry

## 2012-08-28 NOTE — ED Notes (Signed)
Discharge instructions reviewed with pt. Pt verbalized understanding.   

## 2012-08-28 NOTE — ED Provider Notes (Signed)
History     CSN: [id number]  Arrival date & time 08/28/12  4098   First MD Initiated Contact with Patient 08/28/12 [phone]      Chief Complaint  Patient presents with  . Chest Pain    (Consider location/radiation/quality/duration/timing/severity/associated sxs/prior treatment) Patient is a 47 y.o. female presenting with chest pain.  Chest Pain   Patient presents to the ED w/ Chief complaint right sided chest pain and pressure. Patient is a poor historian. History is limited by the mental capacity of the patient, patient's ability to communicate effectively, and overall poor insight.  Patient awoke with mild pain on the right side.  She had been sleeping on that side.  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 She took to advil with some relief of her sxs. She denies dyspnea, cough, hemoptysis, UL leg swelling, nausea, vomiting, wheezing.  She has some tightness. Pain is worsened with movement. Denies fevers, chills, myalgias, arthralgias. Denies DOE, SOB, radiation to left arm, jaw or back, or diaphoresis. Denies dysuria, flank pain, suprapubic pain, frequency, urgency, or hematuria. Denies headaches, light headedness, weakness, visual disturbances. Denies abdominal pain, nausea, vomiting, diarrhea or constipation.    History reviewed. No pertinent past medical history.  Past Surgical History  Procedure Laterality Date  . Fracture surgery      No family history on file.  History  Substance Use Topics  . Smoking status: Current Every Day Smoker -- 1.00 packs/day  . Smokeless tobacco: Not on file  . Alcohol Use: Yes     Comment: occassional    OB History   Grav Para Term Preterm Abortions TAB SAB Ect Mult Living                  Review of Systems  Cardiovascular: Positive for chest pain.   Ten systems reviewed and are negative for acute change, except as noted in the HPI.   Allergies  Review  of patient's allergies indicates no known allergies.  Home Medications   Current Outpatient Rx  Name  Route  Sig  Dispense  Refill  . diphenhydrAMINE (BENADRYL) 25 mg capsule   Oral   Take 25 mg by mouth every 6 (six) hours as needed. Allergies          . ibuprofen (ADVIL,MOTRIN) 200 MG tablet   Oral   Take 800 mg by mouth every 6 (six) hours as needed. For pain           BP 137/65  Pulse 72  Temp(Src) 97.9 F (36.6 C) (Oral)  Resp 18  SpO2 97%  LMP 08/04/2012  Physical Exam  Nursing note and vitals reviewed. Constitutional: She is oriented to person, place, and time. She appears well-developed and well-nourished. No distress.  HENT:  Head: Normocephalic and atraumatic.  Eyes: Conjunctivae are normal. No scleral icterus.  Neck: Normal range of motion.  Cardiovascular: Normal rate, regular rhythm and normal heart sounds.  Exam reveals no gallop and no friction rub.   No murmur heard. Pulmonary/Chest: Effort normal and breath sounds normal. No respiratory distress.  Patient is ttp of the right chest wall in the belly of the pectoralis.  She is guarding and holding the chest tight. Her pain and pressure goes away completely when she relaxes.   Abdominal: Soft. Bowel sounds are normal. She exhibits no distension and no mass. There is no tenderness. There is no guarding.  Neurological:  She is alert and oriented to person, place, and time.  Skin: Skin is warm and dry. She is not diaphoretic.    ED Course  Procedures (including critical care time)  Labs Reviewed - No data to display No results found.   Date: 08/28/2012  Rate: 52  Rhythm: normal sinus rhythm  QRS Axis: normal  Intervals: normal  ST/T Wave abnormalities: normal  Conduction Disutrbances: none  Narrative Interpretation:   Old EKG Reviewed:  None available      1. Chest wall pain       MDM  9:59 AM BP 137/65  Pulse 72  Temp(Src) 97.9 F (36.6 C) (Oral)  Resp 18  SpO2 97%  LMP  08/04/2012 Patient with right sided chest wall pain. She is a smoker, and mother had  MI/cva in her 67s.  She has no pain when she relaxes her chest. DOubt ACS or   Pulmonary etiology. wil obtain cxr. Troponin. EKG unconcerning for ischemic changes.      11:35 AM patient with completely negative istat. No abnormalities on cxr. I believe this is mucsuloskeletal. Will provide work note and NSAIDS.  Arthor Captain, PA-C 08/28/12 1610

## 2012-08-29 NOTE — ED Provider Notes (Signed)
Medical screening examination/treatment/procedure(s) were performed by non-physician practitioner and as supervising physician I was immediately available for consultation/collaboration.   Lyanne Co, MD 08/29/12 [phone]

## 2014-03-07 ENCOUNTER — Emergency Department (HOSPITAL_COMMUNITY): Payer: BC Managed Care – PPO

## 2014-03-07 ENCOUNTER — Emergency Department (HOSPITAL_COMMUNITY)
Admission: EM | Admit: 2014-03-07 | Discharge: 2014-03-07 | Disposition: A | Discharge status: Home / Self Care | Payer: BC Managed Care – PPO | Attending: Emergency Medicine | Admitting: Emergency Medicine

## 2014-03-07 ENCOUNTER — Encounter (HOSPITAL_COMMUNITY): Payer: Self-pay | Admitting: Emergency Medicine

## 2014-03-07 DIAGNOSIS — R1031 Right lower quadrant pain: Secondary | ICD-10-CM

## 2014-03-07 DIAGNOSIS — Z3202 Encounter for pregnancy test, result negative: Secondary | ICD-10-CM | POA: Insufficient documentation

## 2014-03-07 DIAGNOSIS — Z791 Long term (current) use of non-steroidal anti-inflammatories (NSAID): Secondary | ICD-10-CM | POA: Insufficient documentation

## 2014-03-07 DIAGNOSIS — Z720 Tobacco use: Secondary | ICD-10-CM | POA: Insufficient documentation

## 2014-03-07 DIAGNOSIS — N390 Urinary tract infection, site not specified: Secondary | ICD-10-CM | POA: Diagnosis not present

## 2014-03-07 DIAGNOSIS — N898 Other specified noninflammatory disorders of vagina: Secondary | ICD-10-CM | POA: Insufficient documentation

## 2014-03-07 DIAGNOSIS — R112 Nausea with vomiting, unspecified: Secondary | ICD-10-CM | POA: Diagnosis not present

## 2014-03-07 LAB — COMPREHENSIVE METABOLIC PANEL
ALBUMIN: 2.9 g/dL — AB (ref 3.5–5.2)
ALT: 11 U/L (ref 0–35)
AST: 18 U/L (ref 0–37)
Alkaline Phosphatase: 55 U/L (ref 39–117)
Anion gap: 10 (ref 5–15)
BUN: 7 mg/dL (ref 6–23)
CALCIUM: 9.1 mg/dL (ref 8.4–10.5)
CO2: 28 meq/L (ref 19–32)
CREATININE: 0.98 mg/dL (ref 0.50–1.10)
Chloride: 105 mEq/L (ref 96–112)
GFR calc Af Amer: 78 mL/min — ABNORMAL LOW (ref >90)
GFR calc non Af Amer: 67 mL/min — ABNORMAL LOW (ref >90)
Glucose, Bld: 68 mg/dL — ABNORMAL LOW (ref 70–99)
Potassium: 4.2 mEq/L (ref 3.7–5.3)
SODIUM: 143 meq/L (ref 137–147)
TOTAL PROTEIN: 5.9 g/dL — AB (ref 6.0–8.3)
Total Bilirubin: 0.3 mg/dL (ref 0.3–1.2)

## 2014-03-07 LAB — CBC WITH DIFFERENTIAL/PLATELET
BASOS ABS: 0.1 10*3/uL (ref 0.0–0.1)
BASOS PCT: 1 % (ref 0–1)
EOS PCT: 2 % (ref 0–5)
Eosinophils Absolute: 0.1 10*3/uL (ref 0.0–0.7)
HEMATOCRIT: 41.4 % (ref 36.0–46.0)
Hemoglobin: 12.6 g/dL (ref 12.0–15.0)
LYMPHS PCT: 37 % (ref 12–46)
Lymphs Abs: 2.7 10*3/uL (ref 0.7–4.0)
MCH: 24.5 pg — ABNORMAL LOW (ref 26.0–34.0)
MCHC: 30.4 g/dL (ref 30.0–36.0)
MCV: 80.4 fL (ref 78.0–100.0)
MONO ABS: 0.5 10*3/uL (ref 0.1–1.0)
Monocytes Relative: 8 % (ref 3–12)
Neutro Abs: 3.7 10*3/uL (ref 1.7–7.7)
Neutrophils Relative %: 52 % (ref 43–77)
PLATELETS: 256 10*3/uL (ref 150–400)
RBC: 5.15 MIL/uL — ABNORMAL HIGH (ref 3.87–5.11)
RDW: 16.3 % — AB (ref 11.5–15.5)
WBC: 7.1 10*3/uL (ref 4.0–10.5)

## 2014-03-07 LAB — LIPASE, BLOOD: Lipase: 17 U/L (ref 11–59)

## 2014-03-07 LAB — URINALYSIS, ROUTINE W REFLEX MICROSCOPIC
Bilirubin Urine: NEGATIVE
Glucose, UA: NEGATIVE mg/dL
Hgb urine dipstick: NEGATIVE
Ketones, ur: NEGATIVE mg/dL
Nitrite: NEGATIVE
Protein, ur: NEGATIVE mg/dL
Specific Gravity, Urine: 1.02 (ref 1.005–1.030)
Urobilinogen, UA: 1 mg/dL (ref 0.0–1.0)
pH: 6 (ref 5.0–8.0)

## 2014-03-07 LAB — RPR

## 2014-03-07 LAB — WET PREP, GENITAL
Trich, Wet Prep: NONE SEEN
YEAST WET PREP: NONE SEEN

## 2014-03-07 LAB — URINE MICROSCOPIC-ADD ON

## 2014-03-07 LAB — HIV ANTIBODY (ROUTINE TESTING W REFLEX): HIV: NONREACTIVE

## 2014-03-07 LAB — PREGNANCY, URINE: Preg Test, Ur: NEGATIVE

## 2014-03-07 MED ORDER — CEPHALEXIN 500 MG PO CAPS: 500.0000 mg | ORAL_CAPSULE | Freq: Four times a day (QID) | ORAL | Status: DC

## 2014-03-07 MED ORDER — ONDANSETRON 4 MG PO TBDP
4.0000 mg | ORAL_TABLET | Freq: Once | ORAL | Status: AC
  Administered 2014-03-07: 4 mg via ORAL
  Filled 2014-03-07: qty 1

## 2014-03-07 MED ORDER — HYDROCODONE-ACETAMINOPHEN 5-325 MG PO TABS: 1.0000 | ORAL_TABLET | ORAL | Status: DC | PRN

## 2014-03-07 MED ORDER — IOHEXOL 300 MG/ML  SOLN
50.0000 mL | Freq: Once | INTRAMUSCULAR | Status: AC | PRN
  Administered 2014-03-07: 50 mL via ORAL

## 2014-03-07 MED ORDER — IOHEXOL 300 MG/ML  SOLN
100.0000 mL | Freq: Once | INTRAMUSCULAR | Status: AC | PRN
  Administered 2014-03-07: 100 mL via INTRAVENOUS

## 2014-03-07 MED ORDER — ONDANSETRON 4 MG PO TBDP: 4.0000 mg | ORAL_TABLET | Freq: Three times a day (TID) | ORAL | Status: DC | PRN

## 2014-03-07 MED ORDER — MORPHINE SULFATE 4 MG/ML IJ SOLN
4.0000 mg | Freq: Once | INTRAMUSCULAR | Status: AC
  Administered 2014-03-07: 4 mg via INTRAVENOUS
  Filled 2014-03-07: qty 1

## 2014-03-07 MED ORDER — METRONIDAZOLE 500 MG PO TABS: 500.0000 mg | ORAL_TABLET | Freq: Two times a day (BID) | ORAL | Status: DC

## 2014-03-07 MED ORDER — MORPHINE SULFATE 4 MG/ML IJ SOLN
4.0000 mg | Freq: Once | INTRAMUSCULAR | Status: AC
  Administered 2014-03-07: 4 mg via INTRAMUSCULAR
  Filled 2014-03-07: qty 1

## 2014-03-07 NOTE — ED Provider Notes (Signed)
CSN: [id number]     Arrival date & time 03/07/14  1134 History   First MD Initiated Contact with Patient 03/07/14 1237     Chief Complaint  Patient presents with  . Back Pain  . Flank Pain   Elizabeth Whitney is a 48 y.o. female who presents to the ED complaining of right-sided flank pain that radiates to her right lower quadrant for the past 5 days. She reports gradual onset of pain 5 days ago and has had constant pain from her right flank to her right lower quadrant since. She rates the pain at 9 out of 10 and describes it as dull. She reports the pain is worse with standing and movement. She ports and associated with nausea and vomited twice yesterday. She reports her appetite has been normal and has eaten today. She reports current nausea. Previous abdominal surgeries include a tubal ligation and cesarean section.  She has fevers, chills, emesis, hematochezia, melena, diarrhea, dysuria, hematuria, vaginal discharge, vaginal bleeding, or rashes. She is sexually active with her husband and does not use protection. She denies history of STDs. She denies history of kidney stones.   (Consider location/radiation/quality/duration/timing/severity/associated sxs/prior Treatment) HPI  History reviewed. No pertinent past medical history. Past Surgical History  Procedure Laterality Date  . Fracture surgery     No family history on file. History  Substance Use Topics  . Smoking status: Current Every Day Smoker -- 1.00 packs/day  . Smokeless tobacco: Not on file  . Alcohol Use: Yes     Comment: occassional   OB History    No data available     Review of Systems  Constitutional: Negative for fever and chills.  HENT: Negative for congestion, ear pain, sneezing, sore throat and trouble swallowing.   Eyes: Negative for pain and visual disturbance.  Respiratory: Negative for cough, shortness of breath and wheezing.   Cardiovascular: Negative for chest pain and palpitations.  Gastrointestinal:  Positive for nausea, vomiting and abdominal pain. Negative for diarrhea.  Genitourinary: Positive for flank pain. Negative for dysuria, urgency, hematuria, decreased urine volume, vaginal bleeding, vaginal discharge and difficulty urinating.  Musculoskeletal: Negative for myalgias, back pain and neck pain.  Skin: Negative for pallor, rash and wound.  Neurological: Negative for dizziness, syncope, weakness, light-headedness and headaches.  All other systems reviewed and are negative.     Allergies  Review of patient's allergies indicates no known allergies.  Home Medications   Prior to Admission medications   Medication Sig Start Date End Date Taking? Authorizing Provider  diphenhydrAMINE (BENADRYL) 25 mg capsule Take 25 mg by mouth every 6 (six) hours as needed. Allergies    Yes Historical Provider, MD  ibuprofen (ADVIL,MOTRIN) 200 MG tablet Take 800 mg by mouth every 4 (four) hours as needed for headache or moderate pain.   Yes Historical Provider, MD  cephALEXin (KEFLEX) 500 MG capsule Take 1 capsule (500 mg total) by mouth 4 (four) times daily. 03/07/14   Verda Cumins Deion Swift, PA-C  HYDROcodone-acetaminophen (NORCO/VICODIN) 5-325 MG per tablet Take 1 tablet by mouth every 4 (four) hours as needed for moderate pain or severe pain. 03/07/14   Verda Cumins Ritchard Paragas, PA-C  meloxicam (MOBIC) 15 MG tablet Take 1 tablet (15 mg total) by mouth daily. Take 1 daily with food. Patient not taking: Reported on 03/07/2014 08/28/12   Margarita Mail, PA-C  ondansetron (ZOFRAN ODT) 4 MG disintegrating tablet Take 1 tablet (4 mg total) by mouth every 8 (eight) hours as needed for nausea.  03/07/14   Verda Cumins Aarit Kashuba, PA-C   BP 118/76 mmHg  Pulse 61  Temp(Src) 98.1 F (36.7 C) (Oral)  Resp 16  SpO2 100%  LMP 01/21/2014 Physical Exam  Constitutional: She appears well-developed and well-nourished. No distress.  HENT:  Head: Normocephalic and atraumatic.  Mouth/Throat: Oropharynx is clear and  moist. No oropharyngeal exudate.  Eyes: Conjunctivae are normal. Pupils are equal, round, and reactive to light. Right eye exhibits no discharge. Left eye exhibits no discharge.  Neck: Neck supple.  Cardiovascular: Normal rate, regular rhythm, normal heart sounds and intact distal pulses.  Exam reveals no gallop and no friction rub.   No murmur heard. Pulmonary/Chest: Effort normal and breath sounds normal. No respiratory distress. She has no wheezes. She has no rales.  Abdominal: Soft. Bowel sounds are normal. She exhibits no distension and no mass. There is tenderness. There is no rebound and no guarding. Hernia confirmed negative in the right inguinal area and confirmed negative in the left inguinal area.  Patient's abdomen is soft. Tenderness over right lower quadrant. No rebound tenderness. Negative rovsing. Positive psoas sign. Negative obturator sign. No CVA tenderness.  Genitourinary: There is no rash, tenderness, lesion or injury on the right labia. There is no rash, tenderness, lesion or injury on the left labia. Uterus is not deviated, not enlarged and not tender. Cervix exhibits no motion tenderness and no friability. Right adnexum displays tenderness. Right adnexum displays no mass and no fullness. Left adnexum displays no mass, no tenderness and no fullness. No erythema, tenderness or bleeding in the vagina. No foreign body around the vagina. No signs of injury around the vagina.  Pelvic exam performed by me with female chaperone. No lesions or rashes noted.  Minimal amount of white discharge noted. Right adnexal tenderness noted. No left adnexal tenderness or fullness. Cervix is closed without bleeding.   Musculoskeletal: She exhibits no edema.  Lymphadenopathy:    She has no cervical adenopathy.       Right: No inguinal adenopathy present.       Left: No inguinal adenopathy present.  Neurological: She is alert. Coordination normal.  Skin: Skin is warm and dry. No rash noted. She is  not diaphoretic. No erythema. No pallor.  Psychiatric: She has a normal mood and affect. Her behavior is normal.  Nursing note and vitals reviewed.   ED Course  Procedures (including critical care time) Labs Review Labs Reviewed  WET PREP, GENITAL - Abnormal; Notable for the following:    Clue Cells Wet Prep HPF POC FEW (*)    WBC, Wet Prep HPF POC FEW (*)    All other components within normal limits  URINALYSIS, ROUTINE W REFLEX MICROSCOPIC - Abnormal; Notable for the following:    APPearance CLOUDY (*)    Leukocytes, UA SMALL (*)    All other components within normal limits  CBC WITH DIFFERENTIAL - Abnormal; Notable for the following:    RBC 5.15 (*)    MCH 24.5 (*)    RDW 16.3 (*)    All other components within normal limits  COMPREHENSIVE METABOLIC PANEL - Abnormal; Notable for the following:    Glucose, Bld 68 (*)    Total Protein 5.9 (*)    Albumin 2.9 (*)    GFR calc non Af Amer 67 (*)    GFR calc Af Amer 78 (*)    All other components within normal limits  URINE MICROSCOPIC-ADD ON - Abnormal; Notable for the following:    Squamous Epithelial /  LPF MANY (*)    All other components within normal limits  GC/CHLAMYDIA PROBE AMP  URINE CULTURE  PREGNANCY, URINE  LIPASE, BLOOD  HIV ANTIBODY (ROUTINE TESTING)  RPR    Imaging Review Ct Abdomen Pelvis W Contrast  03/07/2014   CLINICAL DATA:  Right side pain, from right groin to back. Nausea vomiting yesterday.  EXAM: CT ABDOMEN AND PELVIS WITH CONTRAST  TECHNIQUE: Multidetector CT imaging of the abdomen and pelvis was performed using the standard protocol following bolus administration of intravenous contrast.  CONTRAST:  49mL OMNIPAQUE IOHEXOL 300 MG/ML SOLN, 171mL OMNIPAQUE IOHEXOL 300 MG/ML SOLN  COMPARISON:  Abdominal ultrasound 10/24/2009  FINDINGS: Lung bases are clear.  No effusions.  Heart is normal size.  Liver, gallbladder, spleen, pancreas, adrenals and kidneys are normal.  No renal or ureteral stones.  No  hydronephrosis.  Appendix is visualized and is normal.  Uterus and adnexa are unremarkable. Small follicles in the ovaries bilaterally. No free fluid, free air or adenopathy.  Stomach, large and small bowel unremarkable. Aorta is normal caliber.  No acute bony abnormality or focal bone lesion.  IMPRESSION: Normal appendix.  No renal or ureteral stones.  No acute findings in the abdomen or pelvis.   Electronically Signed   By: Rolm Baptise M.D.   On: 03/07/2014 16:28     EKG Interpretation None      Filed Vitals:   03/07/14 1149 03/07/14 1410 03/07/14 1535  BP: 119/79 120/81 118/76  Pulse: 65 65 61  Temp: 98.5 F (36.9 C) 98.1 F (36.7 C)   TempSrc: Oral Oral   Resp: 18 14 16   SpO2: 100% 100% 100%     MDM   Meds given in ED:  Medications  morphine 4 MG/ML injection 4 mg (4 mg Intramuscular Given 03/07/14 1329)  ondansetron (ZOFRAN-ODT) disintegrating tablet 4 mg (4 mg Oral Given 03/07/14 1329)  morphine 4 MG/ML injection 4 mg (4 mg Intravenous Given 03/07/14 1531)  iohexol (OMNIPAQUE) 300 MG/ML solution 50 mL (50 mLs Oral Contrast Given 03/07/14 1530)  iohexol (OMNIPAQUE) 300 MG/ML solution 100 mL (100 mLs Intravenous Contrast Given 03/07/14 1609)    New Prescriptions   CEPHALEXIN (KEFLEX) 500 MG CAPSULE    Take 1 capsule (500 mg total) by mouth 4 (four) times daily.   HYDROCODONE-ACETAMINOPHEN (NORCO/VICODIN) 5-325 MG PER TABLET    Take 1 tablet by mouth every 4 (four) hours as needed for moderate pain or severe pain.   ONDANSETRON (ZOFRAN ODT) 4 MG DISINTEGRATING TABLET    Take 1 tablet (4 mg total) by mouth every 8 (eight) hours as needed for nausea.    Final diagnoses:  Right lower quadrant abdominal pain  UTI (lower urinary tract infection)   Darsha E Stevens is a 48 y.o. female who presents to the ED complaining of right-sided flank pain that radiates to her right lower quadrant for the past 5 days. The pain is constant.  The patent is afebrile and non-toxic  appearing. Her WBC is 7.1.  The patient has a contaminated urine sample, will send for culture. It still shows small leukocytes, 7-10 WBCs and mucous is present.  The patient's pelvic exam was remarkable for white discharge and right adnexal tenderness. She has no cervical motion tenderness. Wet prep returned with only few clue cells- Unlikely this is BV and more likely a UTI. Will do a CT due to continued pain and right lower quadrant tenderness.  CT with contrast was unremarkable. Patient reports she is now pain  free and has no nausea. Patient is ready to be discharged. Patient reports she has a ride home.  Will treat this patient for a UTI with keflex. Gonorrhea and Chlamydia are sent for PCR. I advised the patient to follow-up with the women's outpatient clinic this week. I provided  the patient with a resource list of other providers if needed. Advised patient to return to the ED with new or worsening symptoms, worsening abdominal pain, fevers or new concerns. Patient verbalized understanding of plan.  The patient was discussed with Dr. Kathrynn Humble who agrees with assessment and plan.      Hanley Hays, PA-C 03/07/14 1701  Varney Biles, MD 03/07/14 [phone]

## 2014-03-07 NOTE — ED Notes (Signed)
Pt c/o right side pain, radiates from right groin to back. Pt states yesterday had n/v.

## 2014-03-07 NOTE — Discharge Instructions (Signed)
Abdominal Pain Many things can cause abdominal pain. Usually, abdominal pain is not caused by a disease and will improve without treatment. It can often be observed and treated at home. Your health care provider will do a physical exam and possibly order blood tests and X-rays to help determine the seriousness of your pain. However, in many cases, more time must pass before a clear cause of the pain can be found. Before that point, your health care provider may not know if you need more testing or further treatment. HOME CARE INSTRUCTIONS  Monitor your abdominal pain for any changes. The following actions may help to alleviate any discomfort you are experiencing:  Only take over-the-counter or prescription medicines as directed by your health care provider.  Do not take laxatives unless directed to do so by your health care provider.  Try a clear liquid diet (broth, tea, or water) as directed by your health care provider. Slowly move to a bland diet as tolerated. SEEK MEDICAL CARE IF:  You have unexplained abdominal pain.  You have abdominal pain associated with nausea or diarrhea.  You have pain when you urinate or have a bowel movement.  You experience abdominal pain that wakes you in the night.  You have abdominal pain that is worsened or improved by eating food.  You have abdominal pain that is worsened with eating fatty foods.  You have a fever. SEEK IMMEDIATE MEDICAL CARE IF:   Your pain does not go away within 2 hours.  You keep throwing up (vomiting).  Your pain is felt only in portions of the abdomen, such as the right side or the left lower portion of the abdomen.  You pass bloody or black tarry stools. MAKE SURE YOU:  Understand these instructions.   Will watch your condition.   Will get help right away if you are not doing well or get worse.  Document Released: 01/10/2005 Document Revised: 04/07/2013 Document Reviewed: 12/10/2012 [facility] Patient Information  2015 [place], [place]. This information is not intended to replace advice given to you by your health care provider. Make sure you discuss any questions you have with your health care provider. Asymptomatic Bacteriuria Asymptomatic bacteriuria is the presence of a large number of bacteria in your urine without the usual symptoms of burning or frequent urination. The following conditions increase the risk of asymptomatic bacteriuria:  Diabetes mellitus.  Advanced age.  Pregnancy in the first trimester.  Kidney stones.  Kidney transplants.  Leaky kidney tube valve in young children (reflux). Treatment for this condition is not needed in most people and can lead to other problems such as too much yeast and growth of resistant bacteria. However, some people, such as pregnant women, do need treatment to prevent kidney infection. Asymptomatic bacteriuria in pregnancy is also associated with fetal growth restriction, premature labor, and newborn death. HOME CARE INSTRUCTIONS Monitor your condition for any changes. The following actions may help to relieve any discomfort you are feeling:  Drink enough water and fluids to keep your urine clear or pale yellow. Go to the bathroom more often to keep your bladder empty.  Keep the area around your vagina and rectum clean. Wipe yourself from front to back after urinating. SEEK IMMEDIATE MEDICAL CARE IF:  You develop signs of an infection such as:  Burning with urination.  Frequency of voiding.  Back pain.  Fever.  You have blood in the urine.  You develop a fever. MAKE SURE YOU:  Understand these instructions.  Will watch  your condition.  Will get help right away if you are not doing well or get worse. Document Released: 04/02/2005 Document Revised: 08/17/2013 Document Reviewed: 09/22/2012 [facility] Patient Information 2015 [place], [place]. This information is not intended to replace advice given to you by your health care provider. Make  sure you discuss any questions you have with your health care provider.  Emergency Department Resource Guide 1) Find a Doctor and Pay Out of Pocket Although you won't have to find out who is covered by your insurance plan, it is a good idea to ask around and get recommendations. You will then need to call the office and see if the doctor you have chosen will accept you as a new patient and what types of options they offer for patients who are self-pay. Some doctors offer discounts or will set up payment plans for their patients who do not have insurance, but you will need to ask so you aren't surprised when you get to your appointment.  2) Contact Your Local Health Department Not all health departments have doctors that can see patients for sick visits, but many do, so it is worth a call to see if yours does. If you don't know where your local health department is, you can check in your phone book. The CDC also has a tool to help you locate your state's health department, and many state websites also have listings of all of their local health departments.  3) Find a [facility] If your illness is not likely to be very severe or complicated, you may want to try a walk in clinic. These are popping up all over the country in pharmacies, drugstores, and shopping centers. They're usually staffed by nurse practitioners or physician assistants that have been trained to treat common illnesses and complaints. They're usually fairly quick and inexpensive. However, if you have serious medical issues or chronic medical problems, these are probably not your best option.  No Primary Care Doctor: - Call Health Connect at  [phone] - they can help you locate a primary care doctor that  accepts your insurance, provides certain services, etc. - Physician Referral Service- [phone]  Chronic Pain Problems: Organization         Address  Phone   Notes  [facility]  [phone] Patients  need to be referred by their primary care doctor.   Medication Assistance: Organization         Address  Phone   Notes  [facility] Medication [facility] [address] [place], [place] [address] [phone] --Must be a resident of [facility] -- Must have NO insurance coverage whatsoever (no Medicaid/ Medicare, etc.) -- The pt. MUST have a primary care doctor that directs their care regularly and follows them in the community   Med[facility]ist  [phone]   [employer]  [phone]    Agencies that provide inexpensive medical care: Organization         Address  Phone   Notes  [place]  [phone]   [facility]    [phone]   [facility] [place], [place] [address] [phone]   [place] [address] [place] [phone]   Planned Parenthood    [phone]   [facility]    [phone]   [place] and [place] [address] [place], [employer] Phone:  [phone], Fax:  [phone] Hours of Operation:  9 am - 6 pm, M-F.  Also accepts Medicaid/Medicare and self-pay.  [facility] for [place] [address], [address], [place] Phone: [phone], Fax: [phone]. Hours of Operation:  8:30 am - 5:30 pm, M-F.  Also accepts Medicaid and self-pay.  [facility]  [place] [address] [place] Phone: [phone]   [place], [place], [place] [phone], Ext. [phone] Mondays & Thursdays: 7-9 AM.  First 15 patients are seen on a first come, first serve basis.    [place] Providers:  Organization         Address  Phone   Notes  [facility] [address], [place] [phone] Also accepts self-pay patients.  [facility] [address], [place]  [phone]     [place], [address], [place] [phone]   [facility] [address] [place] [phone]   Lucianne Lei [address], [place]   [phone] Only accepts [place] Access [place] patients after they have their name applied to their card.   Self-Pay (no insurance) in [facility]:  Organization         Address  Phone   Notes  Sickle Cell Patients, [facility] [place] [phone]   [facility] [place] [phone]   [facility] [place]  [place], [place], [place] [phone]   [facility]/Dr. Osei-Bonsu  [address] [place] or [address], [place] [phone] Phone number for both [place] and [place] locations is the same.  Urgent Medical and [facility] [address], [place] [phone]   [facility] [address], [place] or [address] [phone]   [facility] [address] [place] [phone], phone; [phone], fax Sees patients 1st and 3rd Saturday of every month.  Must not qualify for public or private insurance (i.e. Medicaid, Medicare, [place] Health Choice, Veterans' Benefits)  Household income should be no more than 200% of the poverty level The clinic cannot treat you if you are pregnant or think you are pregnant  Sexually transmitted diseases are not treated at the clinic.    Dental Care: Organization         Address  Phone  Notes  [facility] Department of [facility] [place] [phone] Accepts children up to age 66 who are enrolled in [place] or [place]; pregnant women with a Medicaid card; and children who have applied for Medicaid or [place] Health Choice, but were declined,  whose parents can pay a reduced fee at time of service.  [facility] Department of [facility]  [address], [place] [phone] Accepts children up to age 37 who are enrolled in [place] or [place]; pregnant women with a Medicaid card; and children who have applied for Medicaid or  Health Choice, but were declined, whose parents can pay a reduced fee at time of service.  [place] Adult Dental Access PROGRAM  [place] [phone] Patients are seen by appointment only.  Walk-ins are not accepted. [place] will see patients 17 years of age and older. Monday - Tuesday (8am-5pm) Most Wednesdays (8:30-5pm) $30 per visit, cash only  [facility] Adult Dental Access PROGRAM  [address], [facility] [phone] Patients are seen by appointment only. Walk-ins are not accepted. Post will see patients 92 years of age and older. One Wednesday Evening (Monthly: Volunteer Based).  $30 per visit, cash only  [place]  [phone] for adults; Children under age 31, call Graduate Pediatric Dentistry at [phone]. Children aged 71-14, please call [phone] to request a pediatric application.  Dental services are provided in all areas of dental care including fillings, crowns and bridges, complete and partial dentures, implants, gum treatment, root canals, and extractions. Preventive care is also provided. Treatment is provided to both adults and children. Patients are selected via a lottery and there is often a waiting list.   [facility] [address], [place]  [phone] [email or web address]   [facility] [address] [place], [place] [phone], Ext. [phone] Second and Fourth Thursday of each month, opens at 6:30 AM; Clinic ends at 9 AM.  Patients are seen on a first-come first-served basis, and a limited number are seen during each clinic.   [facility]  [address] [place] Danker [place], [place] [phone]   Eligibility Requirements You must have lived in [place], [place], or [place] counties for at least the last three months.   You cannot be eligible for state or federal sponsored [employer], including [employer], [place], or [employer].   You generally cannot be eligible for healthcare insurance through your employer.    How to apply: Eligibility screenings are held every Tuesday and Wednesday afternoon from 1:00 pm until 4:00 pm. You do not need an appointment for the interview!  [facility] [address], [place], [place]   [place]  [place] Department  [place]  [phone]    Behavioral Health Resources in the Community: Intensive Outpatient Programs Organization         Address  Phone  Notes  [place]. [address], [place], [place] [phone]   [facility] Outpatient [address], [place], [place]   ADS: [facility] Drug [facility] [address] [place], [place]   [place] [address]  [place], [place] or [phone]   Substance Abuse Resources Organization         Address  Phone  Notes  Alcohol and Drug Services  [phone]   [place]  [phone]   [facility] [place]   Chinita Pester  [phone]   Residential & Outpatient Substance Abuse Program  [phone]   Psychological Services Organization         Address  Phone  Notes  [facility] [place]  [place]  [phone]   [place] [address], [place] [phone] or [phone]    Mobile Crisis Teams Organization         Address  Phone  Notes  [facility], Mobile Crisis Care Unit   [phone]   [facility] Psychotherapeutic Services  [address] [place], [place]   [facility] [address] [place] [phone]  Self-Help/Support Groups Organization         Address  Phone             Notes  [facility] of [place] - variety of support groups  [place] Call for more information  Narcotics Anonymous (NA), [facility] [address], [employer] [place]  2 meetings at this location   Special educational needs teacher         Address  Phone  Notes  ASAP Residential Treatment [place],    [place]  [phone]   [facility]  [address] [place] [address], [place], [place]   [place], [place] [phone] Admissions: 8am-3pm M-F  Incentives Substance [place] [address],    [place], [place] [phone]   [facility] [address] [place], [place], [place]   [facility] [address]  [place], [place]   [facility] - Intensive Outpatient [address] Kristeen [address], [place], [place]   [facility] [address]  [place], [place] [phone] or [phone]   [facility] Services (RTS) [address], [place], [place] Accepts Medicaid  [facility] [address]  [place] [phone] Substance Abuse/Addiction Treatment   [facility] Organization         Address  Phone  Notes  [facility]  [phone]   Domenic Schwab, PhD [address] [place], [place]   [phone] or [phone]   [place], [place] [phone]   [facility] [address] [place], [place] [phone] Insurance/Medicaid/sponsorship through [facility] and Families [address], [address]                                     [place], [place] [phone] [place] [address], [place] [phone]    Dr. Adele Schilder  [phone]   [facility] Dept. 1) [address], [place] 2) [place] 3)  [place] [address], [place] [phone]  [phone]   [place] [phone] or [phone] (After Hours)

## 2014-03-08 ENCOUNTER — Encounter (HOSPITAL_COMMUNITY): Payer: Self-pay | Admitting: *Deleted

## 2014-03-08 ENCOUNTER — Inpatient Hospital Stay (HOSPITAL_COMMUNITY)
Admission: AD | Admit: 2014-03-08 | Discharge: 2014-03-08 | Disposition: A | Discharge status: Home / Self Care | Payer: BC Managed Care – PPO | Source: Ambulatory Visit | Attending: Family Medicine | Admitting: Family Medicine

## 2014-03-08 ENCOUNTER — Inpatient Hospital Stay (HOSPITAL_COMMUNITY): Payer: BC Managed Care – PPO

## 2014-03-08 DIAGNOSIS — D259 Leiomyoma of uterus, unspecified: Secondary | ICD-10-CM | POA: Insufficient documentation

## 2014-03-08 DIAGNOSIS — R102 Pelvic and perineal pain: Secondary | ICD-10-CM | POA: Insufficient documentation

## 2014-03-08 DIAGNOSIS — R109 Unspecified abdominal pain: Secondary | ICD-10-CM

## 2014-03-08 DIAGNOSIS — R1031 Right lower quadrant pain: Secondary | ICD-10-CM | POA: Diagnosis not present

## 2014-03-08 DIAGNOSIS — Z720 Tobacco use: Secondary | ICD-10-CM | POA: Diagnosis not present

## 2014-03-08 HISTORY — DX: Anxiety disorder, unspecified: F41.9

## 2014-03-08 NOTE — MAU Note (Signed)
Pt arrived to [facility] ED c/o back and abdominal pain.  Pt was seen at [facility] ED yesterday and discharged.  Pt presents today with reoccuring back pain/vomiting.

## 2014-03-08 NOTE — MAU Provider Note (Signed)
History   Elizabeth Whitney is a 48 yo female presenting today to MAU for RLQ abdominal pain associated with right sided flank pain. The pain has been present intermittently for the last 5 days, is described as "contraction-like" and a "pulling" sensation. Her pain is moderate and is worse when ambulating and relieved slightly with laying/sitting down. She has tried Ibuprofen for her pain with little and brief relief. Near the beginning of the present illness, she reported experiencing a bilateral temporal headache which lasted for 2 days mildly relieved with ibuprofen. There was no associated aura. [name] reports vomiting around 8 times throughout the present illness associated with nausea. She also reports alternating episodes of constipation and loose stools, most recently not having had a bowel movement since 2 days ago. She reported a one night occurrence of increased urinary frequency throughout the night which has since subsided. She denies fever, visual changes, SOB, and CP. She was recently seen in the [facility] ED (less than 24 hours ago) for the same complaint where she received a full workup for appendicitis, including CT which was unremarkable.    She was given antibiotics for UTI and pain medication in which she has not filled.   CSN: [id number]  Arrival date and time: 03/08/14 1140   First Provider Initiated Contact with Patient 03/08/14 1240      Chief Complaint  Patient presents with  . Abdominal Pain   HPI    Past Medical History  Diagnosis Date  . Anxiety     Past Surgical History  Procedure Laterality Date  . Fracture surgery  2003    rod in ankle  . Cesarean section      History reviewed. No pertinent family history.  History  Substance Use Topics  . Smoking status: Current Every Day Smoker -- 0.25 packs/day for 10 years    Types: Cigarettes  . Smokeless tobacco: Not on file  . Alcohol Use: Yes     Comment: occassional    Allergies: No Known  Allergies  Prescriptions prior to admission  Medication Sig Dispense Refill Last Dose  . cephALEXin (KEFLEX) 500 MG capsule Take 1 capsule (500 mg total) by mouth 4 (four) times daily. 40 capsule 0   . diphenhydrAMINE (BENADRYL) 25 mg capsule Take 25 mg by mouth every 6 (six) hours as needed. Allergies    unknown  . HYDROcodone-acetaminophen (NORCO/VICODIN) 5-325 MG per tablet Take 1 tablet by mouth every 4 (four) hours as needed for moderate pain or severe pain. 10 tablet 0   . ibuprofen (ADVIL,MOTRIN) 200 MG tablet Take 800 mg by mouth every 4 (four) hours as needed for headache or moderate pain.   03/06/2014 at Unknown time  . meloxicam (MOBIC) 15 MG tablet Take 1 tablet (15 mg total) by mouth daily. Take 1 daily with food. (Patient not taking: Reported on 03/07/2014) 10 tablet 0   . ondansetron (ZOFRAN ODT) 4 MG disintegrating tablet Take 1 tablet (4 mg total) by mouth every 8 (eight) hours as needed for nausea. 10 tablet 0    Results for orders placed or performed during the hospital encounter of 03/07/14 (from the past 48 hour(s))  Pregnancy, urine     Status: None   Collection Time: 03/07/14  1:10 PM  Result Value Ref Range   Preg Test, Ur NEGATIVE NEGATIVE    Comment:        THE SENSITIVITY OF THIS METHODOLOGY IS >20 mIU/mL.   Urinalysis, Routine w reflex microscopic  Status: Abnormal   Collection Time: 03/07/14  1:10 PM  Result Value Ref Range   Color, Urine YELLOW YELLOW   APPearance CLOUDY (A) CLEAR   Specific Gravity, Urine 1.020 1.005 - 1.030   pH 6.0 5.0 - 8.0   Glucose, UA NEGATIVE NEGATIVE mg/dL   Hgb urine dipstick NEGATIVE NEGATIVE   Bilirubin Urine NEGATIVE NEGATIVE   Ketones, ur NEGATIVE NEGATIVE mg/dL   Protein, ur NEGATIVE NEGATIVE mg/dL   Urobilinogen, UA 1.0 0.0 - 1.0 mg/dL   Nitrite NEGATIVE NEGATIVE   Leukocytes, UA SMALL (A) NEGATIVE  Urine microscopic-add on     Status: Abnormal   Collection Time: 03/07/14  1:10 PM  Result Value Ref Range    Squamous Epithelial / LPF MANY (A) RARE   WBC, UA 7-10 <3 WBC/hpf   RBC / HPF 0-2 <3 RBC/hpf   Urine-Other MUCOUS PRESENT   CBC with Differential     Status: Abnormal   Collection Time: 03/07/14  1:30 PM  Result Value Ref Range   WBC 7.1 4.0 - 10.5 K/uL   RBC 5.15 (H) 3.87 - 5.11 MIL/uL   Hemoglobin 12.6 12.0 - 15.0 g/dL   HCT 41.4 36.0 - 46.0 %   MCV 80.4 78.0 - 100.0 fL   MCH 24.5 (L) 26.0 - 34.0 pg   MCHC 30.4 30.0 - 36.0 g/dL   RDW 16.3 (H) 11.5 - 15.5 %   Platelets 256 150 - 400 K/uL   Neutrophils Relative % 52 43 - 77 %   Neutro Abs 3.7 1.7 - 7.7 K/uL   Lymphocytes Relative 37 12 - 46 %   Lymphs Abs 2.7 0.7 - 4.0 K/uL   Monocytes Relative 8 3 - 12 %   Monocytes Absolute 0.5 0.1 - 1.0 K/uL   Eosinophils Relative 2 0 - 5 %   Eosinophils Absolute 0.1 0.0 - 0.7 K/uL   Basophils Relative 1 0 - 1 %   Basophils Absolute 0.1 0.0 - 0.1 K/uL  Comprehensive metabolic panel     Status: Abnormal   Collection Time: 03/07/14  1:30 PM  Result Value Ref Range   Sodium 143 137 - 147 mEq/L   Potassium 4.2 3.7 - 5.3 mEq/L   Chloride 105 96 - 112 mEq/L   CO2 28 19 - 32 mEq/L   Glucose, Bld 68 (L) 70 - 99 mg/dL   BUN 7 6 - 23 mg/dL   Creatinine, Ser 0.98 0.50 - 1.10 mg/dL   Calcium 9.1 8.4 - 10.5 mg/dL   Total Protein 5.9 (L) 6.0 - 8.3 g/dL   Albumin 2.9 (L) 3.5 - 5.2 g/dL   AST 18 0 - 37 U/L   ALT 11 0 - 35 U/L   Alkaline Phosphatase 55 39 - 117 U/L   Total Bilirubin 0.3 0.3 - 1.2 mg/dL   GFR calc non Af Amer 67 (L) >90 mL/min   GFR calc Af Amer 78 (L) >90 mL/min    Comment: (NOTE) The eGFR has been calculated using the CKD EPI equation. This calculation has not been validated in all clinical situations. eGFR's persistently <90 mL/min signify possible Chronic Kidney Disease.    Anion gap 10 5 - 15  Lipase, blood     Status: None   Collection Time: 03/07/14  1:30 PM  Result Value Ref Range   Lipase 17 11 - 59 U/L  HIV antibody     Status: None   Collection Time: 03/07/14   1:30 PM  Result Value Ref  Range   HIV 1&2 Ab, 4th Generation NONREACTIVE NONREACTIVE    Comment: (NOTE) A NONREACTIVE HIV Ag/Ab result does not exclude HIV infection since the time frame for seroconversion is variable. If acute HIV infection is suspected, a HIV-1 RNA Qualitative TMA test is recommended. HIV-1/2 Antibody Diff         Not indicated. HIV-1 RNA, Qual TMA           Not indicated. PLEASE NOTE: This information has been disclosed to you from records whose confidentiality may be protected by state law. If your state requires such protection, then the state law prohibits you from making any further disclosure of the information without the specific written consent of the person to whom it pertains, or as otherwise permitted by law. A general authorization for the release of medical or other information is NOT sufficient for this purpose. The performance of this assay has not been clinically validated in patients less than 18 years old. Performed at [facility]   RPR     Status: None   Collection Time: 03/07/14  1:30 PM  Result Value Ref Range   RPR NON REAC NON REAC    Comment: Performed at [facility] prep, genital     Status: Abnormal   Collection Time: 03/07/14  2:19 PM  Result Value Ref Range   Yeast Wet Prep HPF POC NONE SEEN NONE SEEN   Trich, Wet Prep NONE SEEN NONE SEEN   Clue Cells Wet Prep HPF POC FEW (A) NONE SEEN   WBC, Wet Prep HPF POC FEW (A) NONE SEEN   US Transvaginal Non-ob  03/08/2014   CLINICAL DATA:  Right lower quadrant/right flank pain  EXAM: TRANSABDOMINAL AND TRANSVAGINAL ULTRASOUND OF PELVIS  TECHNIQUE: Both transabdominal and transvaginal ultrasound examinations of the pelvis were performed. Transabdominal technique was performed for global imaging of the pelvis including uterus, ovaries, adnexal regions, and pelvic cul-de-sac. It was necessary to proceed with endovaginal exam following the transabdominal exam to visualize the  right ovary.  COMPARISON:  CT abdomen pelvis dated 03/07/2014  FINDINGS: Uterus  Measurements: 9.8 x 5.8 x 7.2 cm. 1.3 x 0.8 x 1.7 cm intramural fibroid in the anterior uterine fundus. 2.6 x 1.3 x 1.9 cm subserosal fibroid in the posterior lower uterine segment.  Endometrium  Thickness: 10 mm.  No focal abnormality visualized.  Right ovary  Measurements: 2.6 x 1.5 x 1.9 cm. Normal appearance/no adnexal mass.  Left ovary  Measurements: 3.2 x 2.1 x 2.2 cm. Normal appearance/no adnexal mass.  Other findings  No free fluid.  IMPRESSION: Two uterine fibroids, measuring up to 2.6 cm, as above.  Otherwise negative pelvic ultrasound.   Electronically Signed   By: Julian Hy M.D.   On: 03/08/2014 14:57   US Pelvis Complete  03/08/2014   CLINICAL DATA:  Right lower quadrant/right flank pain  EXAM: TRANSABDOMINAL AND TRANSVAGINAL ULTRASOUND OF PELVIS  TECHNIQUE: Both transabdominal and transvaginal ultrasound examinations of the pelvis were performed. Transabdominal technique was performed for global imaging of the pelvis including uterus, ovaries, adnexal regions, and pelvic cul-de-sac. It was necessary to proceed with endovaginal exam following the transabdominal exam to visualize the right ovary.  COMPARISON:  CT abdomen pelvis dated 03/07/2014  FINDINGS: Uterus  Measurements: 9.8 x 5.8 x 7.2 cm. 1.3 x 0.8 x 1.7 cm intramural fibroid in the anterior uterine fundus. 2.6 x 1.3 x 1.9 cm subserosal fibroid in the posterior lower uterine segment.  Endometrium  Thickness:  10 mm.  No focal abnormality visualized.  Right ovary  Measurements: 2.6 x 1.5 x 1.9 cm. Normal appearance/no adnexal mass.  Left ovary  Measurements: 3.2 x 2.1 x 2.2 cm. Normal appearance/no adnexal mass.  Other findings  No free fluid.  IMPRESSION: Two uterine fibroids, measuring up to 2.6 cm, as above.  Otherwise negative pelvic ultrasound.   Electronically Signed   By: Julian Hy M.D.   On: 03/08/2014 14:57   Ct Abdomen Pelvis W  Contrast  03/07/2014   CLINICAL DATA:  Right side pain, from right groin to back. Nausea vomiting yesterday.  EXAM: CT ABDOMEN AND PELVIS WITH CONTRAST  TECHNIQUE: Multidetector CT imaging of the abdomen and pelvis was performed using the standard protocol following bolus administration of intravenous contrast.  CONTRAST:  59m OMNIPAQUE IOHEXOL 300 MG/ML SOLN, 1020mOMNIPAQUE IOHEXOL 300 MG/ML SOLN  COMPARISON:  Abdominal ultrasound 10/24/2009  FINDINGS: Lung bases are clear.  No effusions.  Heart is normal size.  Liver, gallbladder, spleen, pancreas, adrenals and kidneys are normal.  No renal or ureteral stones.  No hydronephrosis.  Appendix is visualized and is normal.  Uterus and adnexa are unremarkable. Small follicles in the ovaries bilaterally. No free fluid, free air or adenopathy.  Stomach, large and small bowel unremarkable. Aorta is normal caliber.  No acute bony abnormality or focal bone lesion.  IMPRESSION: Normal appendix.  No renal or ureteral stones.  No acute findings in the abdomen or pelvis.   Electronically Signed   By: KeRolm Baptise.D.   On: 03/07/2014 16:28    Review of Systems  Constitutional: Positive for chills. Negative for fever.  HENT:       Duration-2 days, but has since resided.   Eyes: Negative for blurred vision and double vision.  Cardiovascular: Negative for chest pain and palpitations.  Gastrointestinal: Positive for nausea, vomiting, abdominal pain, diarrhea and constipation.  Genitourinary: Positive for flank pain. Negative for dysuria, urgency and frequency.       Right sided flank pain   Skin: Negative for rash.  Neurological: Positive for headaches.   Physical Exam   Blood pressure 120/90, pulse 72, temperature 98.1 F (36.7 C), temperature source Oral, resp. rate 14, height _0  (1.651 m), weight 93.895 kg (207 lb), last menstrual period 01/21/2014.  Physical Exam  Constitutional: She is oriented to person, place, and time. She appears well-developed  and well-nourished.  Non-toxic appearance. She does not have a sickly appearance. She does not appear ill. No distress.  HENT:  Head: Normocephalic and atraumatic.  Neck: Neck supple.  Cardiovascular: Normal rate, regular rhythm and normal heart sounds.   Respiratory: Effort normal and breath sounds normal.  GI: Soft. There is tenderness.  Right lower quadrant  Neurological: She is alert and oriented to person, place, and time.  Skin: Skin is warm and dry. No rash noted. She is not diaphoretic. No erythema.  Psychiatric: Her behavior is normal.    MAU Course  Procedures  None   MDM Pelvic USK[place]omplete to rule out ovarian torsion    Assessment and Plan  1. Pelvic pain, unclear etiology 2. Small uterine fibroids  Plan: Discharge home in stable condition This pain is likely not GYN in nature Filled RX per [employer] up with PCP    RaGevena BarreStPalmetto11/23/2015, 1:05 PM   Evaluation and management procedures were performed by the PA student under my supervision and collaboration. I have reviewed the note and chart, and  I agree with the management and plan.  Darrelyn Hillock Rasch, NP 03/08/2014 5:25 PM

## 2014-03-08 NOTE — Discharge Instructions (Signed)
Abdominal Pain, Women °Abdominal (stomach, pelvic, or belly) pain can be caused by many things. It is important to tell your doctor: °· The location of the pain. °· Does it come and go or is it present all the time? °· Are there things that start the pain (eating certain foods, exercise)? °· Are there other symptoms associated with the pain (fever, nausea, vomiting, diarrhea)? °All of this is helpful to know when trying to find the cause of the pain. °CAUSES  °· Stomach: virus or bacteria infection, or ulcer. °· Intestine: appendicitis (inflamed appendix), regional ileitis (Crohn's disease), ulcerative colitis (inflamed colon), irritable bowel syndrome, diverticulitis (inflamed diverticulum of the colon), or cancer of the stomach or intestine. °· Gallbladder disease or stones in the gallbladder. °· Kidney disease, kidney stones, or infection. °· Pancreas infection or cancer. °· Fibromyalgia (pain disorder). °· Diseases of the female organs: °¨ Uterus: fibroid (non-cancerous) tumors or infection. °¨ Fallopian tubes: infection or tubal pregnancy. °¨ Ovary: cysts or tumors. °¨ Pelvic adhesions (scar tissue). °¨ Endometriosis (uterus lining tissue growing in the pelvis and on the pelvic organs). °¨ Pelvic congestion syndrome (female organs filling up with blood just before the menstrual period). °¨ Pain with the menstrual period. °¨ Pain with ovulation (producing an egg). °¨ Pain with an IUD (intrauterine device, birth control) in the uterus. °¨ Cancer of the female organs. °· Functional pain (pain not caused by a disease, may improve without treatment). °· Psychological pain. °· Depression. °DIAGNOSIS  °Your doctor will decide the seriousness of your pain by doing an examination. °· Blood tests. °· X-rays. °· Ultrasound. °· CT scan (computed tomography, special type of X-ray). °· MRI (magnetic resonance imaging). °· Cultures, for infection. °· Barium enema (dye inserted in the large intestine, to better view it with  X-rays). °· Colonoscopy (looking in intestine with a lighted tube). °· Laparoscopy (minor surgery, looking in abdomen with a lighted tube). °· Major abdominal exploratory surgery (looking in abdomen with a large incision). °TREATMENT  °The treatment will depend on the cause of the pain.  °· Many cases can be observed and treated at home. °· Over-the-counter medicines recommended by your caregiver. °· Prescription medicine. °· Antibiotics, for infection. °· Birth control pills, for painful periods or for ovulation pain. °· Hormone treatment, for endometriosis. °· Nerve blocking injections. °· Physical therapy. °· Antidepressants. °· Counseling with a psychologist or psychiatrist. °· Minor or major surgery. °HOME CARE INSTRUCTIONS  °· Do not take laxatives, unless directed by your caregiver. °· Take over-the-counter pain medicine only if ordered by your caregiver. Do not take aspirin because it can cause an upset stomach or bleeding. °· Try a clear liquid diet (broth or water) as ordered by your caregiver. Slowly move to a bland diet, as tolerated, if the pain is related to the stomach or intestine. °· Have a thermometer and take your temperature several times a day, and record it. °· Bed rest and sleep, if it helps the pain. °· Avoid sexual intercourse, if it causes pain. °· Avoid stressful situations. °· Keep your follow-up appointments and tests, as your caregiver orders. °· If the pain does not go away with medicine or surgery, you may try: °¨ Acupuncture. °¨ Relaxation exercises (yoga, meditation). °¨ Group therapy. °¨ Counseling. °SEEK MEDICAL CARE IF:  °· You notice certain foods cause stomach pain. °· Your home care treatment is not helping your pain. °· You need stronger pain medicine. °· You want your IUD removed. °· You feel faint or   lightheaded. °· You develop nausea and vomiting. °· You develop a rash. °· You are having side effects or an allergy to your medicine. °SEEK IMMEDIATE MEDICAL CARE IF:  °· Your  pain does not go away or gets worse. °· You have a fever. °· Your pain is felt only in portions of the abdomen. The right side could possibly be appendicitis. The left lower portion of the abdomen could be colitis or diverticulitis. °· You are passing blood in your stools (bright red or black tarry stools, with or without vomiting). °· You have blood in your urine. °· You develop chills, with or without a fever. °· You pass out. °MAKE SURE YOU:  °· Understand these instructions. °· Will watch your condition. °· Will get help right away if you are not doing well or get worse. °Document Released: 01/28/2007 Document Revised: 08/17/2013 Document Reviewed: 02/17/2009 °[employer]® Patient Information ©2015 [employer]. This information is not intended to replace advice given to you by your health care provider. Make sure you discuss any questions you have with your health care provider. ° °

## 2014-03-09 LAB — URINE CULTURE: Special Requests: NORMAL

## 2014-03-09 LAB — GC/CHLAMYDIA PROBE AMP
CT PROBE, AMP APTIMA: NEGATIVE
GC PROBE AMP APTIMA: NEGATIVE

## 2014-05-04 ENCOUNTER — Emergency Department (HOSPITAL_COMMUNITY)
Admission: EM | Admit: 2014-05-04 | Discharge: 2014-05-04 | Disposition: A | Discharge status: Home / Self Care | Payer: BLUE CROSS/BLUE SHIELD | Attending: Emergency Medicine | Admitting: Emergency Medicine

## 2014-05-04 ENCOUNTER — Encounter (HOSPITAL_COMMUNITY): Payer: Self-pay

## 2014-05-04 ENCOUNTER — Emergency Department (HOSPITAL_COMMUNITY): Payer: Self-pay

## 2014-05-04 DIAGNOSIS — R0789 Other chest pain: Secondary | ICD-10-CM

## 2014-05-04 DIAGNOSIS — Z3202 Encounter for pregnancy test, result negative: Secondary | ICD-10-CM | POA: Insufficient documentation

## 2014-05-04 DIAGNOSIS — R079 Chest pain, unspecified: Secondary | ICD-10-CM

## 2014-05-04 DIAGNOSIS — F419 Anxiety disorder, unspecified: Secondary | ICD-10-CM | POA: Insufficient documentation

## 2014-05-04 DIAGNOSIS — Z720 Tobacco use: Secondary | ICD-10-CM | POA: Insufficient documentation

## 2014-05-04 LAB — BASIC METABOLIC PANEL
Anion gap: 4 — ABNORMAL LOW (ref 5–15)
BUN: 9 mg/dL (ref 6–23)
CHLORIDE: 107 meq/L (ref 96–112)
CO2: 28 mmol/L (ref 19–32)
CREATININE: 0.94 mg/dL (ref 0.50–1.10)
Calcium: 8.5 mg/dL (ref 8.4–10.5)
GFR calc Af Amer: 82 mL/min — ABNORMAL LOW (ref >90)
GFR calc non Af Amer: 71 mL/min — ABNORMAL LOW (ref >90)
Glucose, Bld: 115 mg/dL — ABNORMAL HIGH (ref 70–99)
Potassium: 3.6 mmol/L (ref 3.5–5.1)
Sodium: 139 mmol/L (ref 135–145)

## 2014-05-04 LAB — CBC
HCT: 36 % (ref 36.0–46.0)
Hemoglobin: 11.2 g/dL — ABNORMAL LOW (ref 12.0–15.0)
MCH: 24.6 pg — ABNORMAL LOW (ref 26.0–34.0)
MCHC: 31.1 g/dL (ref 30.0–36.0)
MCV: 78.9 fL (ref 78.0–100.0)
Platelets: 294 10*3/uL (ref 150–400)
RBC: 4.56 MIL/uL (ref 3.87–5.11)
RDW: 16.2 % — AB (ref 11.5–15.5)
WBC: 7 10*3/uL (ref 4.0–10.5)

## 2014-05-04 LAB — POC URINE PREG, ED: Preg Test, Ur: NEGATIVE

## 2014-05-04 LAB — I-STAT TROPONIN, ED: TROPONIN I, POC: 0 ng/mL (ref 0.00–0.08)

## 2014-05-04 MED ORDER — DIAZEPAM 5 MG PO TABS
5.0000 mg | ORAL_TABLET | Freq: Once | ORAL | Status: AC
  Administered 2014-05-04: 5 mg via ORAL
  Filled 2014-05-04: qty 1

## 2014-05-04 MED ORDER — TRAMADOL HCL 50 MG PO TABS
50.0000 mg | ORAL_TABLET | Freq: Four times a day (QID) | ORAL | Status: DC | PRN
Start: 2014-05-04 — End: 2014-09-02

## 2014-05-04 MED ORDER — CYCLOBENZAPRINE HCL 10 MG PO TABS: 10.0000 mg | ORAL_TABLET | Freq: Two times a day (BID) | ORAL | Status: DC | PRN

## 2014-05-04 NOTE — Discharge Instructions (Signed)
Chest Wall Pain Chest wall pain is pain in or around the bones and muscles of your chest. It may take up to 6 weeks to get better. It may take longer if you must stay physically active in your work and activities.  CAUSES  Chest wall pain may happen on its own. However, it may be caused by:  A viral illness like the flu.  Injury.  Coughing.  Exercise.  Arthritis.  Fibromyalgia.  Shingles. HOME CARE INSTRUCTIONS   Avoid overtiring physical activity. Try not to strain or perform activities that cause pain. This includes any activities using your chest or your abdominal and side muscles, especially if heavy weights are used.  Put ice on the sore area.  Put ice in a plastic bag.  Place a towel between your skin and the bag.  Leave the ice on for 15-20 minutes per hour while awake for the first 2 days.  Only take over-the-counter or prescription medicines for pain, discomfort, or fever as directed by your caregiver. SEEK IMMEDIATE MEDICAL CARE IF:   Your pain increases, or you are very uncomfortable.  You have a fever.  Your chest pain becomes worse.  You have new, unexplained symptoms.  You have nausea or vomiting.  You feel sweaty or lightheaded.  You have a cough with phlegm (sputum), or you cough up blood. MAKE SURE YOU:   Understand these instructions.  Will watch your condition.  Will get help right away if you are not doing well or get worse. Document Released: 04/02/2005 Document Revised: 06/25/2011 Document Reviewed: 11/27/2010 [facility] Patient Information 2015 [place], [place]. This information is not intended to replace advice given to you by your health care provider. Make sure you discuss any questions you have with your health care provider.   Emergency Department Resource Guide 1) Find a Doctor and Pay Out of Pocket Although you won't have to find out who is covered by your insurance plan, it is a good idea to ask around and get recommendations. You  will then need to call the office and see if the doctor you have chosen will accept you as a new patient and what types of options they offer for patients who are self-pay. Some doctors offer discounts or will set up payment plans for their patients who do not have insurance, but you will need to ask so you aren't surprised when you get to your appointment.  2) Contact Your Local Health Department Not all health departments have doctors that can see patients for sick visits, but many do, so it is worth a call to see if yours does. If you don't know where your local health department is, you can check in your phone book. The CDC also has a tool to help you locate your state's health department, and many state websites also have listings of all of their local health departments.  3) Find a [facility] If your illness is not likely to be very severe or complicated, you may want to try a walk in clinic. These are popping up all over the country in pharmacies, drugstores, and shopping centers. They're usually staffed by nurse practitioners or physician assistants that have been trained to treat common illnesses and complaints. They're usually fairly quick and inexpensive. However, if you have serious medical issues or chronic medical problems, these are probably not your best option.  No Primary Care Doctor: - Call Health Connect at  [phone] - they can help you locate a primary care doctor that  accepts your insurance, provides certain services, etc. - Physician Referral Service- [phone]  Chronic Pain Problems: Organization         Address  Phone   Notes  [facility]  [phone] Patients need to be referred by their primary care doctor.   Medication Assistance: Organization         Address  Phone   Notes  [facility] Medication [facility] [address] [place], [place] [address] [phone] --Must be a resident of [facility] -- Must  have NO insurance coverage whatsoever (no Medicaid/ Medicare, etc.) -- The pt. MUST have a primary care doctor that directs their care regularly and follows them in the community   MedAssist  [phone]   [employer]  [phone]    Agencies that provide inexpensive medical care: Organization         Address  Phone   Notes  [place]  [phone]   [facility]    [phone]   [facility] [place], [place] [address] [phone]   [place] [address], [place] [phone]   Planned Parenthood    [phone]   [facility]    [phone]   [place] and [place] [address], [place] Phone:  [phone], Fax:  [phone] Hours of Operation:  9 am - 6 pm, M-F.  Also accepts Medicaid/Medicare and self-pay.  [facility] for [place] [address], [address], [place] Phone: [phone], Fax: [phone]. Hours of Operation:  8:30 am - 5:30 pm, M-F.  Also accepts Medicaid and self-pay.  [facility] [address] [place] Phone: [phone]   [place], [place], [place] [phone], Ext. [phone] Mondays & Thursdays: 7-9 AM.  First 15 patients are seen on a first come, first serve basis.    [place] Providers:  Organization         Address  Phone   Notes  [facility] [address], [place] [phone] Also accepts self-pay patients.  [facility] [address], [place]  [phone]   [place], [address], [place] [phone]   [facility] [address] [place] [phone]   Lucianne Lei [address], [place]   [phone] Only accepts [place] Access [place] patients after  they have their name applied to their card.   Self-Pay (no insurance) in [facility]:  Organization         Address  Phone   Notes  Sickle Cell Patients, [facility] [place] [phone]   [facility] [place] [phone]   [facility] [place]  [place], [place], [place] [phone]   [facility]/Dr. Osei-Bonsu  [address] [place] [address], [place] [phone] Phone number for both [place] and [place] locations is the same.  Urgent Medical and [facility] [address] [place] [phone]   [facility] [address] [place] or [address] [phone]   [place] [facility] [place] [phone], phone; [phone], fax Sees patients 1st and 3rd Saturday of every month.  Must not qualify for public or private insurance (i.e. Medicaid, Medicare, [place] Health Choice, Veterans' Benefits)  Household income should be no more than 200% of the poverty level The clinic cannot treat you if you are pregnant or think you are pregnant  Sexually transmitted diseases are not treated at the clinic.    Dental Care: Organization         Address  Phone  Notes  [facility] of [facility] [place] [phone] Accepts children up to age 38 who are enrolled in [place] or [place]; pregnant women with a Medicaid card; and children who have applied for Medicaid or [place] Health Choice, but were declined, whose parents can pay a reduced fee at time of service.  [facility] Department of [facility]  [address], [place] [phone] Accepts children up to age 17 who are enrolled in [place] or [place]; pregnant women with a Medicaid card; and children who  have applied for Medicaid or [place] Health Choice, but were declined, whose parents can pay a reduced fee at time of service.  [place] Adult Dental Access PROGRAM  [place] [phone] Patients are seen by appointment only. Walk-ins are not accepted. [place] will see patients 14 years of age and older. Monday - Tuesday (8am-5pm) Most Wednesdays (8:30-5pm) $30 per visit, cash only  [facility] Adult Dental Access PROGRAM  [address], [facility] [phone] Patients are seen by appointment only. Walk-ins are not accepted. [place] will see patients 58 years of age and older. One Wednesday Evening (Monthly: Volunteer Based).  $30 per visit, cash only  [place]  [phone] for adults; Children under age 28, call Graduate Pediatric Dentistry at [phone]. Children aged 8-14, please call [phone] to request a pediatric application.  Dental services are provided in all areas of dental care including fillings, crowns and bridges, complete and partial dentures, implants, gum treatment, root canals, and extractions. Preventive care is also provided. Treatment is provided to both adults and children. Patients are selected via a lottery and there is often a waiting list.   [facility] [address] [place]  [phone] [email or web address]   [facility] [address] [place], [place] [phone], Ext. [phone] Second and Fourth Thursday of each month, opens at 6:30 AM; Clinic ends at 9 AM.  Patients are seen on a first-come first-served basis, and a limited number are seen during each clinic.   [facility]  [address] Hillard Danker [place], [place] [phone]   Eligibility Requirements You must have lived in [place], [place], or [place] counties for at least the last three months.   You cannot be eligible for state or federal sponsored [employer], including [employer], [place], or [employer].   You generally cannot be eligible for healthcare insurance through your employer.    How to apply: Eligibility screenings are held every Tuesday and Wednesday afternoon from 1:00 pm until 4:00 pm. You do not need an appointment for the interview!  [facility] [address], [place], [place]   [place]  [place] Department  [place] Department  Gays in the Community: Intensive Outpatient Programs Organization         Address  Phone  Notes  [place]. [address], [place], [place] [phone]   [facility] [address] [place], [place]   ADS: Alcohol & Drug Svcs [address], [place], [place]   [place] [address]  [place], [place] or [phone]   Substance Abuse Resources Organization         Address  Phone  Notes  Alcohol and Drug Services  [phone]   [place]  [phone]   [facility] [place]   Chinita Pester  [phone]   Residential & Outpatient Substance Abuse Program  [phone]   Psychological Services Organization         Address  Phone  Notes  [facility] [place]  [place]  [phone]   [place] [address] [place] or [phone]    Mobile Crisis Teams Organization         Address  Phone  Notes  [facility], Mobile Crisis Care Unit  [phone]   [facility] Psychotherapeutic Services  [address] [place], [place]   [place] Levels [address] [place] [phone]    Self-Help/Support Groups Organization         Address  Phone             Notes  [place]. of [place] -  variety of support groups  [place] Call for more information  Narcotics Anonymous (NA), [facility]aring [facility] [address], [employer] [place]  2 meetings at this location   Special educational needs teacher         Address  Phone  Notes  ASAP Residential Treatment [place],    [place]  [phone]   [facility]  [address] [place] [address], [place], [place]   [place], [place] [phone] Admissions: 8am-3pm M-F  Incentives Substance [place] [address]    [place], [place] [phone]   [facility] [address] [place], [place], [place]   [facility] [address],  [place], [place]   [facility] Programs - Intensive Outpatient [address], [place], [place]   [facility] ([place] [address]  [place], [place] [phone] or [phone]   [facility]idential Treatment Services (RTS) [address], [place], [place] Accepts Medicaid  [facility] [place] [address]  [place] [phone] Substance Abuse/Addiction Treatment   [facility] Organization         Address  Phone  Notes  [facility]  [phone]   Domenic Schwab, PhD [address] Arlis [place], [place]   [phone] or [phone]   [place] [address] [place], [place] [phone]   [place] [address], [place], [place] [phone] Insurance/Medicaid/sponsorship through [employer] and Families [address] [place]                                    [place], [place] [phone] [place] [address]  [place], [place] [phone]    Dr. Adele Schilder  [phone]   [facility] Dept. 1) [address] [place] 2) [place] 3)  [place] [address], [place] [phone]  [phone]   [place] [phone] or [phone] (After Hours)

## 2014-05-04 NOTE — ED Provider Notes (Signed)
CSN: [id number]     Arrival date & time 05/04/14  2142 History   First MD Initiated Contact with Patient 05/04/14 2157     Chief Complaint  Patient presents with  . Chest Pain   HPI  Patient is a 49 year old female with past medical history of anxiety presents emergency room for evaluation of right-sided chest pain. Patient states that she was at rest at about 4 PM when the pain started. She describes the pain as pressure that is an 8 out of 10. Her pain is worse when she moves her right arm. The pain radiates into her right shoulder. She tried ibuprofen with minimal relief at 7:30. Patient does admit to some dizziness and lightheadedness yesterday which has resolved today. Patient has no cardiac history. She currently smokes a quarter pack per day for the past 10 years. Patient has a family medical history of MIs in both her maternal grandmother and her mom around age 8. Patient denies surgeries, recent travel, immobilization, history of cancer, pregnancy.  Past Medical History  Diagnosis Date  . Anxiety    Past Surgical History  Procedure Laterality Date  . Fracture surgery  2003    rod in ankle  . Cesarean section     History reviewed. No pertinent family history. History  Substance Use Topics  . Smoking status: Current Every Day Smoker -- 0.25 packs/day for 10 years    Types: Cigarettes  . Smokeless tobacco: Not on file  . Alcohol Use: Yes     Comment: occassional   OB History    Gravida Para Term Preterm AB TAB SAB Ectopic Multiple Living   3 3 3             Review of Systems  Constitutional: Negative for fever, chills and fatigue.  Eyes: Negative for visual disturbance.  Respiratory: Negative for cough, chest tightness, shortness of breath and wheezing.   Cardiovascular: Positive for chest pain. Negative for palpitations and leg swelling.  Gastrointestinal: Negative for nausea, vomiting, abdominal pain, diarrhea and constipation.  Genitourinary: Negative for dysuria,  urgency, frequency, hematuria and difficulty urinating.  Neurological: Positive for dizziness and light-headedness. Negative for weakness, numbness and headaches.  All other systems reviewed and are negative.     Allergies  Review of patient's allergies indicates no known allergies.  Home Medications   Prior to Admission medications   Medication Sig Start Date End Date Taking? Authorizing Provider  acetaminophen (TYLENOL) 325 MG tablet Take 650 mg by mouth every 6 (six) hours as needed for mild pain.   Yes Historical Provider, MD  cephALEXin (KEFLEX) 500 MG capsule Take 1 capsule (500 mg total) by mouth 4 (four) times daily. Patient not taking: Reported on 05/04/2014 03/07/14   Verda Cumins Dansie, PA-C  cyclobenzaprine (FLEXERIL) 10 MG tablet Take 1 tablet (10 mg total) by mouth 2 (two) times daily as needed for muscle spasms. 05/04/14   Erman Thum A Forcucci, PA-C  HYDROcodone-acetaminophen (NORCO/VICODIN) 5-325 MG per tablet Take 1 tablet by mouth every 4 (four) hours as needed for moderate pain or severe pain. Patient not taking: Reported on 05/04/2014 03/07/14   Verda Cumins Dansie, PA-C  meloxicam (MOBIC) 15 MG tablet Take 1 tablet (15 mg total) by mouth daily. Take 1 daily with food. Patient not taking: Reported on 03/08/2014 08/28/12   Margarita Mail, PA-C  ondansetron (ZOFRAN ODT) 4 MG disintegrating tablet Take 1 tablet (4 mg total) by mouth every 8 (eight) hours as needed for nausea. Patient not taking: Reported  on 05/04/2014 03/07/14   Verda Cumins Dansie, PA-C  traMADol (ULTRAM) 50 MG tablet Take 1 tablet (50 mg total) by mouth every 6 (six) hours as needed. 05/04/14   Aracelie Addis A Forcucci, PA-C   BP 128/87 mmHg  Pulse 67  Temp(Src) 98.3 F (36.8 C) (Oral)  Resp 22  Ht 5\' 5"  (1.651 m)  Wt 201 lb (91.173 kg)  BMI 33.45 kg/m2  SpO2 100%  LMP 04/26/2014 Physical Exam  Constitutional: She is oriented to person, place, and time. She appears well-developed and well-nourished.  No distress.  HENT:  Head: Normocephalic and atraumatic.  Mouth/Throat: Oropharynx is clear and moist. No oropharyngeal exudate.  Eyes: Conjunctivae and EOM are normal. Pupils are equal, round, and reactive to light. No scleral icterus.  Neck: Normal range of motion. Neck supple. No JVD present. No thyromegaly present.  Cardiovascular: Normal rate, regular rhythm, normal heart sounds and intact distal pulses.  Exam reveals no gallop and no friction rub.   No murmur heard. Pulmonary/Chest: Effort normal and breath sounds normal. No respiratory distress. She has no wheezes. She has no rales. She exhibits tenderness.  Abdominal: Soft. Bowel sounds are normal. She exhibits no distension and no mass. There is no tenderness. There is no rebound and no guarding.  Lymphadenopathy:    She has no cervical adenopathy.  Neurological: She is alert and oriented to person, place, and time. She has normal strength. No cranial nerve deficit or sensory deficit. Coordination normal.  Skin: Skin is warm and dry. She is not diaphoretic.  Psychiatric: She has a normal mood and affect. Her behavior is normal. Judgment and thought content normal.  Nursing note and vitals reviewed.   ED Course  Procedures (including critical care time) Labs Review Labs Reviewed  CBC - Abnormal; Notable for the following:    Hemoglobin 11.2 (*)    MCH 24.6 (*)    RDW 16.2 (*)    All other components within normal limits  BASIC METABOLIC PANEL - Abnormal; Notable for the following:    Glucose, Bld 115 (*)    GFR calc non Af Amer 71 (*)    GFR calc Af Amer 82 (*)    Anion gap 4 (*)    All other components within normal limits  I-STAT TROPOININ, ED  POC URINE PREG, ED    Imaging Review Dg Chest 2 View  05/04/2014   CLINICAL DATA:  Right side chest pain, right shoulder pain starting today 4p.m.  EXAM: CHEST  2 VIEW  COMPARISON:  08/28/2012  FINDINGS: Cardiomediastinal silhouette is stable. No acute infiltrate or pleural  effusion. No pulmonary edema. Bony thorax is unremarkable.  IMPRESSION: No active cardiopulmonary disease.   Electronically Signed   By: Lahoma Crocker M.D.   On: 05/04/2014 22:24     EKG Interpretation   Date/Time:  Tuesday May 04 2014 21:49:38 EST Ventricular Rate:  73 PR Interval:  158 QRS Duration: 84 QT Interval:  380 QTC Calculation: 418 R Axis:   20 Text Interpretation:  Normal sinus rhythm Normal ECG since last tracing no  significant change Confirmed by Eulis Foster  MD, ELLIOTT [phone]) on 05/04/2014  10:11:12 PM      MDM   Final diagnoses:  Chest wall pain   Patient is a 49 year old female who presents emergency room for evaluation of right-sided chest pain times one day. Patient states that she was lifting heavy boxes on further questioning. She has tenderness palpation of the right chest. EKG reveals no significant changes  and normal sinus rhythm. Chest x-ray is negative for acute abnormalities. CBC feels mild anemia. I-STAT troponin is negative. Urine pregnancy is negative. BMP is unremarkable. This is likely musculoskeletal in nature. Patient has a heart score of 2. Patient is also pertinent negative. Doubt PE at this time. We'll discharge the patient home at this time with Ultram and Flexeril as needed for. Have encouraged rice therapy. Have instructed the patient follow-up with a PCP of her choosing from the resource list in 2 weeks if no improvement. Patient to return for worsening shortness of breath, chest pain, or any other concerning symptoms. She states understanding and agreement at this time. Patient discussed with Dr. Eulis Foster who agrees the above workup and plan. Patient stable for discharge.    Cherylann Parr, PA-C 05/04/14 [place], MD 05/04/14 [phone]

## 2014-05-04 NOTE — ED Notes (Signed)
PA-C at bedside. Pt placed in gown and in bed. Pt monitored by pulse ox, bp cuff, and 5-lead.

## 2014-05-04 NOTE — ED Notes (Signed)
Pt from home with chest pain that started at 9 pm tonight.  Pt sts she was in bed when pain started.  Reported having some right-sided neck pain earlier this morning that went away.  Pt took 800mg  of ibuprofen at home.  No cardiac hx. No other s/s.

## 2014-08-16 ENCOUNTER — Emergency Department (HOSPITAL_COMMUNITY): Payer: 59

## 2014-08-16 ENCOUNTER — Emergency Department (HOSPITAL_COMMUNITY)
Admission: EM | Admit: 2014-08-16 | Discharge: 2014-08-16 | Disposition: A | Discharge status: Home / Self Care | Payer: 59 | Attending: Emergency Medicine | Admitting: Emergency Medicine

## 2014-08-16 ENCOUNTER — Encounter (HOSPITAL_COMMUNITY): Payer: Self-pay | Admitting: Intensive Care

## 2014-08-16 DIAGNOSIS — Z720 Tobacco use: Secondary | ICD-10-CM | POA: Insufficient documentation

## 2014-08-16 DIAGNOSIS — M542 Cervicalgia: Secondary | ICD-10-CM | POA: Diagnosis present

## 2014-08-16 DIAGNOSIS — R55 Syncope and collapse: Secondary | ICD-10-CM | POA: Insufficient documentation

## 2014-08-16 DIAGNOSIS — F419 Anxiety disorder, unspecified: Secondary | ICD-10-CM | POA: Insufficient documentation

## 2014-08-16 DIAGNOSIS — Z79899 Other long term (current) drug therapy: Secondary | ICD-10-CM | POA: Insufficient documentation

## 2014-08-16 DIAGNOSIS — M62838 Other muscle spasm: Secondary | ICD-10-CM | POA: Diagnosis not present

## 2014-08-16 DIAGNOSIS — Z8781 Personal history of (healed) traumatic fracture: Secondary | ICD-10-CM | POA: Diagnosis not present

## 2014-08-16 LAB — I-STAT CHEM 8, ED
BUN: 9 mg/dL (ref 6–20)
CHLORIDE: 105 mmol/L (ref 101–111)
CREATININE: 1.1 mg/dL — AB (ref 0.44–1.00)
Calcium, Ion: 1.22 mmol/L (ref 1.12–1.23)
Glucose, Bld: 89 mg/dL (ref 70–99)
HCT: 38 % (ref 36.0–46.0)
HEMOGLOBIN: 12.9 g/dL (ref 12.0–15.0)
Potassium: 4.4 mmol/L (ref 3.5–5.1)
Sodium: 140 mmol/L (ref 135–145)
TCO2: 21 mmol/L (ref 0–100)

## 2014-08-16 LAB — I-STAT BETA HCG BLOOD, ED (MC, WL, AP ONLY): I-stat hCG, quantitative: <5 m[IU]/mL (ref <5)

## 2014-08-16 LAB — URINALYSIS, ROUTINE W REFLEX MICROSCOPIC
BILIRUBIN URINE: NEGATIVE
GLUCOSE, UA: NEGATIVE mg/dL
Hgb urine dipstick: NEGATIVE
KETONES UR: 15 mg/dL — AB
NITRITE: NEGATIVE
PH: 7.5 (ref 5.0–8.0)
PROTEIN: NEGATIVE mg/dL
Specific Gravity, Urine: 1.021 (ref 1.005–1.030)
Urobilinogen, UA: 1 mg/dL (ref 0.0–1.0)

## 2014-08-16 LAB — URINE MICROSCOPIC-ADD ON

## 2014-08-16 LAB — CBG MONITORING, ED: Glucose-Capillary: 93 mg/dL (ref 70–99)

## 2014-08-16 MED ORDER — DIAZEPAM 5 MG/ML IJ SOLN
5.0000 mg | Freq: Once | INTRAMUSCULAR | Status: AC
  Administered 2014-08-16: 5 mg via INTRAMUSCULAR
  Filled 2014-08-16: qty 2

## 2014-08-16 MED ORDER — ONDANSETRON 4 MG PO TBDP: 8.0000 mg | ORAL_TABLET | Freq: Once | ORAL | Status: AC

## 2014-08-16 MED ORDER — TRAMADOL HCL 50 MG PO TABS: 50.0000 mg | ORAL_TABLET | Freq: Four times a day (QID) | ORAL | Status: DC | PRN

## 2014-08-16 MED ORDER — METHOCARBAMOL 500 MG PO TABS: 500.0000 mg | ORAL_TABLET | Freq: Two times a day (BID) | ORAL | Status: DC

## 2014-08-16 MED ORDER — METHOCARBAMOL 1000 MG/10ML IJ SOLN
1000.0000 mg | Freq: Once | INTRAMUSCULAR | Status: DC
  Filled 2014-08-16: qty 10

## 2014-08-16 MED ORDER — HYDROMORPHONE HCL 1 MG/ML IJ SOLN
1.0000 mg | Freq: Once | INTRAMUSCULAR | Status: AC
  Administered 2014-08-16: 1 mg via INTRAMUSCULAR
  Filled 2014-08-16: qty 1

## 2014-08-16 MED ORDER — ONDANSETRON 4 MG PO TBDP
ORAL_TABLET | ORAL | Status: AC
  Administered 2014-08-16: 8 mg
  Filled 2014-08-16: qty 2

## 2014-08-16 NOTE — Discharge Instructions (Signed)
Please follow up with your primary care physician in 1-2 days. If you do not have one please call the [place] number listed above. Please take pain medication and/or muscle relaxants as prescribed and as needed for pain. Please do not drive on narcotic pain medication or on muscle relaxants. Please read all discharge instructions and return precautions.   Muscle Cramps and Spasms Muscle cramps and spasms occur when a muscle or muscles tighten and you have no control over this tightening (involuntary muscle contraction). They are a common problem and can develop in any muscle. The most common place is in the calf muscles of the leg. Both muscle cramps and muscle spasms are involuntary muscle contractions, but they also have differences:   Muscle cramps are sporadic and painful. They may last a few seconds to a quarter of an hour. Muscle cramps are often more forceful and last longer than muscle spasms.  Muscle spasms may or may not be painful. They may also last just a few seconds or much longer. CAUSES  It is uncommon for cramps or spasms to be due to a serious underlying problem. In many cases, the cause of cramps or spasms is unknown. Some common causes are:   Overexertion.   Overuse from repetitive motions (doing the same thing over and over).   Remaining in a certain position for a long period of time.   Improper preparation, form, or technique while performing a sport or activity.   Dehydration.   Injury.   Side effects of some medicines.   Abnormally low levels of the salts and ions in your blood (electrolytes), especially potassium and calcium. This could happen if you are taking water pills (diuretics) or you are pregnant.  Some underlying medical problems can make it more likely to develop cramps or spasms. These include, but are not limited to:   Diabetes.   Parkinson disease.   Hormone disorders, such as thyroid problems.   Alcohol  abuse.   Diseases specific to muscles, joints, and bones.   Blood vessel disease where not enough blood is getting to the muscles.  HOME CARE INSTRUCTIONS   Stay well hydrated. Drink enough water and fluids to keep your urine clear or pale yellow.  It may be helpful to massage, stretch, and relax the affected muscle.  For tight or tense muscles, use a warm towel, heating pad, or hot shower water directed to the affected area.  If you are sore or have pain after a cramp or spasm, applying ice to the affected area may relieve discomfort.  Put ice in a plastic bag.  Place a towel between your skin and the bag.  Leave the ice on for 15-20 minutes, 03-04 times a day.  Medicines used to treat a known cause of cramps or spasms may help reduce their frequency or severity. Only take over-the-counter or prescription medicines as directed by your caregiver. SEEK MEDICAL CARE IF:  Your cramps or spasms get more severe, more frequent, or do not improve over time.  MAKE SURE YOU:   Understand these instructions.  Will watch your condition.  Will get help right away if you are not doing well or get worse. Document Released: 09/22/2001 Document Revised: 07/28/2012 Document Reviewed: 03/19/2012 [facility] Patient Information 2015 [place], [place]. This information is not intended to replace advice given to you by your health care provider. Make sure you discuss any questions you have with your health care provider.   Vasovagal Syncope, Adult Syncope,  commonly known as fainting, is a temporary loss of consciousness. It occurs when the blood flow to the brain is reduced. Vasovagal syncope (also called neurocardiogenic syncope) is a fainting spell in which the blood flow to the brain is reduced because of a sudden drop in heart rate and blood pressure. Vasovagal syncope occurs when the brain and the cardiovascular system (blood vessels) do not adequately communicate and respond to each other. This is  the most common cause of fainting. It often occurs in response to fear or some other type of emotional or physical stress. The body has a reaction in which the heart starts beating too slowly or the blood vessels expand, reducing blood pressure. This type of fainting spell is generally considered harmless. However, injuries can occur if a person takes a sudden fall during a fainting spell.  CAUSES  Vasovagal syncope occurs when a person's blood pressure and heart rate decrease suddenly, usually in response to a trigger. Many things and situations can trigger an episode. Some of these include:   Pain.   Fear.   The sight of blood or medical procedures, such as blood being drawn from a vein.   Common activities, such as coughing, swallowing, stretching, or going to the bathroom.   Emotional stress.   Prolonged standing, especially in a warm environment.   Lack of sleep or rest.   Prolonged lack of food.   Prolonged lack of fluids.   Recent illness.  The use of certain drugs that affect blood pressure, such as cocaine, alcohol, marijuana, inhalants, and opiates.  SYMPTOMS  Before the fainting episode, you may:   Feel dizzy or light headed.   Become pale.  Sense that you are going to faint.   Feel like the room is spinning.   Have tunnel vision, only seeing directly in front of you.   Feel sick to your stomach (nauseous).   See spots or slowly lose vision.   Hear ringing in your ears.   Have a headache.   Feel warm and sweaty.   Feel a sensation of pins and needles. During the fainting spell, you will generally be unconscious for no longer than a couple minutes before waking up and returning to normal. If you get up too quickly before your body can recover, you may faint again. Some twitching or jerky movements may occur during the fainting spell.  DIAGNOSIS  Your caregiver will ask about your symptoms, take a medical history, and perform a physical  exam. Various tests may be done to rule out other causes of fainting. These may include blood tests and tests to check the heart, such as electrocardiography, echocardiography, and possibly an electrophysiology study. When other causes have been ruled out, a test may be done to check the body's response to changes in position (tilt table test). TREATMENT  Most cases of vasovagal syncope do not require treatment. Your caregiver may recommend ways to avoid fainting triggers and may provide home strategies for preventing fainting. If you must be exposed to a possible trigger, you can drink additional fluids to help reduce your chances of having an episode of vasovagal syncope. If you have warning signs of an oncoming episode, you can respond by positioning yourself favorably (lying down). If your fainting spells continue, you may be given medicines to prevent fainting. Some medicines may help make you more resistant to repeated episodes of vasovagal syncope. Special exercises or compression stockings may be recommended. In rare cases, the surgical placement of a pacemaker  is considered. HOME CARE INSTRUCTIONS   Learn to identify the warning signs of vasovagal syncope.   Sit or lie down at the first warning sign of a fainting spell. If sitting, put your head down between your legs. If you lie down, swing your legs up in the air to increase blood flow to the brain.   Avoid hot tubs and saunas.  Avoid prolonged standing.  Drink enough fluids to keep your urine clear or pale yellow. Avoid caffeine.  Increase salt in your diet as directed by your caregiver.   If you have to stand for a long time, perform movements such as:   Crossing your legs.   Flexing and stretching your leg muscles.   Squatting.   Moving your legs.   Bending over.   Only take over-the-counter or prescription medicines as directed by your caregiver. Do not suddenly stop any medicines without asking your caregiver  first. SEEK MEDICAL CARE IF:   Your fainting spells continue or happen more frequently in spite of treatment.   You lose consciousness for more than a couple minutes.  You have fainting spells during or after exercising or after being startled.   You have new symptoms that occur with the fainting spells, such as:   Shortness of breath.  Chest pain.   Irregular heartbeat.   You have episodes of twitching or jerky movements that last longer than a few seconds.  You have episodes of twitching or jerky movements without obvious fainting. SEEK IMMEDIATE MEDICAL CARE IF:   You have injuries or bleeding after a fainting spell.   You have episodes of twitching or jerky movements that last longer than 5 minutes.   You have more than one spell of twitching or jerky movements before returning to consciousness after fainting. MAKE SURE YOU:   Understand these instructions.  Will watch your condition.  Will get help right away if you are not doing well or get worse. Document Released: 03/19/2012 Document Reviewed: 03/19/2012 [facility] Patient Information 2015 [place]. This information is not intended to replace advice given to you by your health care provider. Make sure you discuss any questions you have with your health care provider.

## 2014-08-16 NOTE — ED Notes (Signed)
Pt states she has been feeling a sharp pain in her left shoulder/neck that radiates up to the top of the left side of her head. She was walking inside yesterday around 4:00pm from feeling dizzy and states she passed out and fell. Pt states once she got inside to the couch she passed out again.

## 2014-08-16 NOTE — ED Provider Notes (Signed)
ED ECG REPORT   Date: 08/16/2014  Rate: 72  Rhythm: normal sinus rhythm  QRS Axis: normal  Intervals: normal  ST/T Wave abnormalities: normal  Conduction Disutrbances:none  Narrative Interpretation:   Old EKG Reviewed: none available  I have personally reviewed the EKG tracing and agree with the computerized printout as noted.   Ezequiel Essex, MD 08/16/14 [id number]

## 2014-08-16 NOTE — ED Provider Notes (Signed)
CSN: [id number]     Arrival date & time 08/16/14  0701 History   First MD Initiated Contact with Patient 08/16/14 [phone]     Chief Complaint  Patient presents with  . Neck Pain     (Consider location/radiation/quality/duration/timing/severity/associated sxs/prior Treatment) HPI Comments: Patient is a 49 yo F past medical history significant for anxiety presenting to the emergency department for evaluation of left-sided neck pain that started Saturday morning when she awoke. Patient describes it as a "crick in her neck" and didn't think much of it. She states yesterday she was not feeling well, she states she felt lightheaded and flushed and had 2 episodes where she passed out. States she fell and landed on her right hip the first time but did not hit her head. She states she had not eaten or drinking anything until after passing out the second time. No seizure like activity. No headache, chest pain, shortness of breath.   Past Medical History  Diagnosis Date  . Anxiety    Past Surgical History  Procedure Laterality Date  . Fracture surgery  2003    rod in ankle  . Cesarean section     No family history on file. History  Substance Use Topics  . Smoking status: Current Every Day Smoker -- 0.25 packs/day for 10 years    Types: Cigarettes  . Smokeless tobacco: Not on file  . Alcohol Use: Yes     Comment: occassional   OB History    Gravida Para Term Preterm AB TAB SAB Ectopic Multiple Living   3 3 3             Review of Systems  Respiratory: Negative for shortness of breath.   Cardiovascular: Negative for chest pain and leg swelling.  Musculoskeletal: Positive for neck pain.  Neurological: Positive for light-headedness. Negative for dizziness, weakness, numbness and headaches.  All other systems reviewed and are negative.     Allergies  Review of patient's allergies indicates no known allergies.  Home Medications   Prior to Admission medications   Medication Sig Start  Date End Date Taking? Authorizing Provider  acetaminophen (TYLENOL) 325 MG tablet Take 650 mg by mouth every 6 (six) hours as needed for mild pain.    Historical Provider, MD  cephALEXin (KEFLEX) 500 MG capsule Take 1 capsule (500 mg total) by mouth 4 (four) times daily. Patient not taking: Reported on 05/04/2014 03/07/14   Waynetta Pean, PA-C  cyclobenzaprine (FLEXERIL) 10 MG tablet Take 1 tablet (10 mg total) by mouth 2 (two) times daily as needed for muscle spasms. Patient not taking: Reported on 08/16/2014 05/04/14   Starlyn Skeans, PA-C  HYDROcodone-acetaminophen (NORCO/VICODIN) 5-325 MG per tablet Take 1 tablet by mouth every 4 (four) hours as needed for moderate pain or severe pain. Patient not taking: Reported on 05/04/2014 03/07/14   Waynetta Pean, PA-C  meloxicam (MOBIC) 15 MG tablet Take 1 tablet (15 mg total) by mouth daily. Take 1 daily with food. Patient not taking: Reported on 03/08/2014 08/28/12   Margarita Mail, PA-C  methocarbamol (ROBAXIN) 500 MG tablet Take 1 tablet (500 mg total) by mouth 2 (two) times daily. 08/16/14   Manessa Buley, PA-C  ondansetron (ZOFRAN ODT) 4 MG disintegrating tablet Take 1 tablet (4 mg total) by mouth every 8 (eight) hours as needed for nausea. Patient not taking: Reported on 05/04/2014 03/07/14   Waynetta Pean, PA-C  traMADol (ULTRAM) 50 MG tablet Take 1 tablet (50 mg total) by mouth every 6 (six) hours  as needed. Patient not taking: Reported on 08/16/2014 05/04/14   Loma Sousa Forcucci, PA-C  traMADol (ULTRAM) 50 MG tablet Take 1 tablet (50 mg total) by mouth every 6 (six) hours as needed. 08/16/14   Dravin Lance, PA-C   BP 116/73 mmHg  Pulse 65  Temp(Src) 99 F (37.2 C) (Oral)  Resp 16  Ht 5\' 5"  (1.651 m)  Wt 202 lb (91.627 kg)  BMI 33.61 kg/m2  SpO2 100%  LMP 08/06/2014 Physical Exam  Constitutional: She is oriented to person, place, and time. She appears well-developed and well-nourished. No distress.  HENT:  Head: Normocephalic  and atraumatic.    Right Ear: External ear normal.  Left Ear: External ear normal.  Nose: Nose normal.  Mouth/Throat: Oropharynx is clear and moist. No oropharyngeal exudate.  Eyes: Conjunctivae and EOM are normal. Pupils are equal, round, and reactive to light.  Neck: Normal range of motion. Neck supple. Muscular tenderness present. No spinous process tenderness present.    Cardiovascular: Normal rate, regular rhythm, normal heart sounds and intact distal pulses.   Pulmonary/Chest: Effort normal and breath sounds normal. No respiratory distress.  Abdominal: Soft. There is no tenderness.  Musculoskeletal: Normal range of motion.  Neurological: She is alert and oriented to person, place, and time. She has normal strength. No cranial nerve deficit. Gait normal. GCS eye subscore is 4. GCS verbal subscore is 5. GCS motor subscore is 6.  Sensation grossly intact.  No pronator drift.  Bilateral heel-knee-shin intact.  Skin: Skin is warm and dry. She is not diaphoretic.  Nursing note and vitals reviewed.   ED Course  Procedures (including critical care time) Medications  diazepam (VALIUM) injection 5 mg (5 mg Intramuscular Given 08/16/14 0838)  HYDROmorphone (DILAUDID) injection 1 mg (1 mg Intramuscular Given 08/16/14 0929)  ondansetron (ZOFRAN-ODT) 4 MG disintegrating tablet (8 mg  Given 08/16/14 1130)  ondansetron (ZOFRAN-ODT) disintegrating tablet 8 mg (0 mg Oral Duplicate 4/0/98 1191)    Labs Review Labs Reviewed  URINALYSIS, ROUTINE W REFLEX MICROSCOPIC - Abnormal; Notable for the following:    APPearance CLOUDY (*)    Ketones, ur 15 (*)    Leukocytes, UA SMALL (*)    All other components within normal limits  URINE MICROSCOPIC-ADD ON - Abnormal; Notable for the following:    Squamous Epithelial / LPF FEW (*)    Bacteria, UA FEW (*)    All other components within normal limits  I-STAT CHEM 8, ED - Abnormal; Notable for the following:    Creatinine, Ser 1.10 (*)    All other  components within normal limits  CBG MONITORING, ED  I-STAT BETA HCG BLOOD, ED ([facility], [facility], [facility] ONLY)    Imaging Review Dg Sacrum/coccyx  08/16/2014   CLINICAL DATA:  [place] backwards on concrete.  sacrum coccyx pain.  EXAM: SACRUM AND COCCYX - 2+ VIEW  COMPARISON:  None.  FINDINGS: Normal sacrococcygeal alignment. No definite displaced fracture or malalignment. Symmetric normal SI joints. Bony pelvis appears intact. Pelvic calcifications consistent with venous phleboliths.  IMPRESSION: No acute finding by plain radiography   Electronically Signed   By: Jerilynn Mages.  Shick M.D.   On: 08/16/2014 08:17   Ct Head Wo Contrast  08/16/2014   CLINICAL DATA:  49 year old female status post syncope with fall yesterday. Pain radiating from the top of the head on the left through the cervical spine to the left shoulder. Headache. Initial encounter.  EXAM: CT HEAD WITHOUT CONTRAST  CT CERVICAL SPINE WITHOUT CONTRAST  TECHNIQUE: Multidetector CT  imaging of the head and cervical spine was performed following the standard protocol without intravenous contrast. Multiplanar CT image reconstructions of the cervical spine were also generated.  COMPARISON:  Brain MRI 02/18/2007. Head CT 01/08/2007. Cervical spine radiographs 01/18/2005.  FINDINGS: CT HEAD FINDINGS  Visualized paranasal sinuses and mastoids are clear. No scalp hematoma identified. Visualized orbit soft tissues are within normal limits. Calvarium intact.  Cerebral volume remains normal. No suspicious intracranial vascular hyperdensity. No midline shift, ventriculomegaly, mass effect, evidence of mass lesion, intracranial hemorrhage or evidence of cortically based acute infarction. Gray-white matter differentiation is within normal limits throughout the brain.  CT CERVICAL SPINE FINDINGS  More straightening/reversal of cervical lordosis compared to 2006. Visualized skull base is intact. No atlanto-occipital dissociation. Bilateral posterior element alignment is within normal  limits. Cervicothoracic junction alignment is within normal limits.  Multilevel bulky degenerative endplate spurring and/or ossification of the posterior longitudinal ligament, maximal at C5 and C6. Subsequent mild to moderate cervical spinal stenosis at C5, AP thecal sac estimated at 5-6 mm. Superimposed broad-based disc protrusions.  No acute cervical spine fracture.  Negative lung apices.  Negative noncontrast paraspinal soft tissues.  IMPRESSION: 1. Stable and normal noncontrast CT appearance of the brain. 2. No acute fracture or listhesis identified in the cervical spine. Ligamentous injury is not excluded. 3. 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 Superimposed multilevel disc and endplate degeneration.   Electronically Signed   By: Genevie Ann M.D.   On: 08/16/2014 10:17   Ct Cervical Spine Wo Contrast  08/16/2014   CLINICAL DATA:  49 year old female status post syncope with fall yesterday. Pain radiating from the top of the head on the left through the cervical spine to the left shoulder. Headache. Initial encounter.  EXAM: CT HEAD WITHOUT CONTRAST  CT CERVICAL SPINE WITHOUT CONTRAST  TECHNIQUE: Multidetector CT imaging of the head and cervical spine was performed following the standard protocol without intravenous contrast. Multiplanar CT image reconstructions of the cervical spine were also generated.  COMPARISON:  Brain MRI 02/18/2007. Head CT 01/08/2007. Cervical spine radiographs 01/18/2005.  FINDINGS: CT HEAD FINDINGS  Visualized paranasal sinuses and mastoids are clear. No scalp hematoma identified. Visualized orbit soft tissues are within normal limits. Calvarium intact.  Cerebral volume remains normal. No suspicious intracranial vascular hyperdensity. No midline shift, ventriculomegaly, mass effect, evidence of mass lesion, intracranial hemorrhage or evidence of cortically based acute infarction. Gray-white matter  differentiation is within normal limits throughout the brain.  CT CERVICAL SPINE FINDINGS  More straightening/reversal of cervical lordosis compared to 2006. Visualized skull base is intact. No atlanto-occipital dissociation. Bilateral posterior element alignment is within normal limits. Cervicothoracic junction alignment is within normal limits.  Multilevel bulky degenerative endplate spurring and/or ossification of the posterior longitudinal ligament, maximal at C5 and C6. Subsequent mild to moderate cervical spinal stenosis at C5, AP thecal sac estimated at 5-6 mm. Superimposed broad-based disc protrusions.  No acute cervical spine fracture.  Negative lung apices.  Negative noncontrast paraspinal soft tissues.  IMPRESSION: 1. Stable and normal noncontrast CT appearance of the brain. 2. No acute fracture or listhesis identified in the cervical spine. Ligamentous injury is not excluded. 3. 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 Superimposed multilevel disc and endplate degeneration.   Electronically Signed   By: Genevie Ann M.D.   On: 08/16/2014 10:17  EKG Interpretation None      Patient demanding imaging of her head and neck, discussed risk benefit of radiation and symptoms, patient still requesting imaging.    MDM   Final diagnoses:  Neurocardiogenic syncope  Cervical paraspinal muscle spasm   Filed Vitals:   08/16/14 1110  BP: 116/73  Pulse: 65  Temp:   Resp: 16   Afebrile, NAD, non-toxic appearing, AAOx4. No neurofocal deficits on examination.  1) Neck Pain: Neurovascularly intact. Normal sensation. No evidence of compartment syndrome. Reproducible left-sided neck pain along the trapezius and paraspinal muscles. No spinal tenderness. Range of motion intact. Low suspicion for subarachnoid hemorrhage, intracranial hemorrhage, dissection. CT head and neck unremarkable. Pain improved while in the  emergency department.  2) Neurocardiogenic syncope: No neurofocal deficits. No precipitating HA, SOB, CP. EKG unremarkable. Labs unremarkable. No further episodes of lightheadedness or loss of consciousness.    Return precautions discussed. Patient is agreeable to plan. Patient is stable at time of discharge. Patient d/w with Dr. Wyvonnia Dusky, agrees with plan.    Baron Sane, PA-C 08/16/14 [place], MD 08/16/14 1620

## 2014-09-02 ENCOUNTER — Encounter (HOSPITAL_COMMUNITY): Payer: Self-pay | Admitting: Emergency Medicine

## 2014-09-02 ENCOUNTER — Emergency Department (INDEPENDENT_AMBULATORY_CARE_PROVIDER_SITE_OTHER)
Admission: EM | Admit: 2014-09-02 | Discharge: 2014-09-02 | Disposition: A | Discharge status: Home / Self Care | Payer: 59 | Source: Home / Self Care | Attending: Emergency Medicine | Admitting: Emergency Medicine

## 2014-09-02 DIAGNOSIS — R51 Headache: Secondary | ICD-10-CM

## 2014-09-02 DIAGNOSIS — R519 Headache, unspecified: Secondary | ICD-10-CM

## 2014-09-02 MED ORDER — ONDANSETRON 4 MG PO TBDP
ORAL_TABLET | ORAL | Status: AC
  Filled 2014-09-02: qty 1

## 2014-09-02 MED ORDER — ONDANSETRON 4 MG PO TBDP
4.0000 mg | ORAL_TABLET | Freq: Once | ORAL | Status: AC
  Administered 2014-09-02: 4 mg via ORAL

## 2014-09-02 MED ORDER — KETOROLAC TROMETHAMINE 60 MG/2ML IM SOLN
60.0000 mg | Freq: Once | INTRAMUSCULAR | Status: AC
  Administered 2014-09-02: 60 mg via INTRAMUSCULAR

## 2014-09-02 MED ORDER — KETOROLAC TROMETHAMINE 60 MG/2ML IM SOLN
INTRAMUSCULAR | Status: AC
  Filled 2014-09-02: qty 2

## 2014-09-02 NOTE — Discharge Instructions (Signed)
Your headache is likely either a migraine or muscle tension headache. We gave you some medicines here to help. Please go home and take one of your muscle relaxers and take a nap. You should feel much better when you wake up. Follow-up as needed.

## 2014-09-02 NOTE — ED Provider Notes (Signed)
CSN: [id number]     Arrival date & time 09/02/14  0941 History   First MD Initiated Contact with Patient 09/02/14 1042     Chief Complaint  Patient presents with  . Headache   (Consider location/radiation/quality/duration/timing/severity/associated sxs/prior Treatment) HPI  She is a 49 year old woman here for evaluation of headache. She states the headache started this morning about 5 AM. It is located in the left side of her head head. It is described as a constant pulling. No associated phonophobia or photophobia. She does report some mild nausea. No vomiting. No change in vision. No focal numbness, tingling, weakness. She does have cervical muscle spasms, for which she takes a muscle relaxer. She reports a history of migraine headaches, but states this was over 10 years ago and she was sensitive to light with the migraines. She has tried 800 mg ibuprofen without improvement.   Past Medical History  Diagnosis Date  . Anxiety    Past Surgical History  Procedure Laterality Date  . Fracture surgery  2003    rod in ankle  . Cesarean section     No family history on file. History  Substance Use Topics  . Smoking status: Current Every Day Smoker -- 0.25 packs/day for 10 years    Types: Cigarettes  . Smokeless tobacco: Not on file  . Alcohol Use: Yes     Comment: occassional   OB History    Gravida Para Term Preterm AB TAB SAB Ectopic Multiple Living   3 3 3             Review of Systems As in history of present illness Allergies  Review of patient's allergies indicates no known allergies.  Home Medications   Prior to Admission medications   Medication Sig Start Date End Date Taking? Authorizing Provider  acetaminophen (TYLENOL) 325 MG tablet Take 650 mg by mouth every 6 (six) hours as needed for mild pain.    Historical Provider, MD  methocarbamol (ROBAXIN) 500 MG tablet Take 1 tablet (500 mg total) by mouth 2 (two) times daily. 08/16/14   Jennifer Piepenbrink, PA-C  traMADol  (ULTRAM) 50 MG tablet Take 1 tablet (50 mg total) by mouth every 6 (six) hours as needed. 08/16/14   Jennifer Piepenbrink, PA-C   BP 154/92 mmHg  Pulse 67  Temp(Src) 98.6 F (37 C) (Oral)  Resp 16  SpO2 100%  LMP 08/29/2014 Physical Exam  Constitutional: She is oriented to person, place, and time. She appears well-developed and well-nourished. No distress.  Neck:  Tender over left trapezius cervical paraspinous muscles.  Cardiovascular: Normal rate.   Neurological: She is alert and oriented to person, place, and time. No cranial nerve deficit. She exhibits normal muscle tone. Coordination normal.  Romberg negative    ED Course  Procedures (including critical care time) Labs Review Labs Reviewed - No data to display  Imaging Review No results found.   MDM   1. Acute nonintractable headache, unspecified headache type    Toradol 60 mg IM and Zofran 4 mg ODT given. Headache is migrainous versus muscle tension. Head CT done the beginning of this month is reviewed and is normal. Recommended taking a muscle relaxer at home. Follow-up as needed.    Melony Overly, MD 09/02/14 [phone]

## 2014-09-02 NOTE — ED Notes (Signed)
Patient c/o muscle spasms in her neck causing her to have headaches. Patient reports headache this morning around 5 am which caused her to wake up out from her sleep. Patient reports she took 800 mg ibuprofen around 6 am and no relief. NAD.

## 2015-03-15 ENCOUNTER — Ambulatory Visit: Payer: Self-pay | Admitting: Family Medicine

## 2015-03-18 ENCOUNTER — Ambulatory Visit: Payer: Self-pay | Admitting: Family Medicine

## 2015-10-23 ENCOUNTER — Encounter (HOSPITAL_COMMUNITY): Payer: Self-pay | Admitting: *Deleted

## 2015-10-23 ENCOUNTER — Emergency Department (HOSPITAL_COMMUNITY)
Admission: EM | Admit: 2015-10-23 | Discharge: 2015-10-23 | Disposition: A | Discharge status: Home / Self Care | Payer: BLUE CROSS/BLUE SHIELD | Attending: Emergency Medicine | Admitting: Emergency Medicine

## 2015-10-23 DIAGNOSIS — Y929 Unspecified place or not applicable: Secondary | ICD-10-CM | POA: Insufficient documentation

## 2015-10-23 DIAGNOSIS — W57XXXA Bitten or stung by nonvenomous insect and other nonvenomous arthropods, initial encounter: Secondary | ICD-10-CM | POA: Diagnosis not present

## 2015-10-23 DIAGNOSIS — Z79899 Other long term (current) drug therapy: Secondary | ICD-10-CM | POA: Insufficient documentation

## 2015-10-23 DIAGNOSIS — F17210 Nicotine dependence, cigarettes, uncomplicated: Secondary | ICD-10-CM | POA: Insufficient documentation

## 2015-10-23 DIAGNOSIS — Y939 Activity, unspecified: Secondary | ICD-10-CM | POA: Diagnosis not present

## 2015-10-23 DIAGNOSIS — Y999 Unspecified external cause status: Secondary | ICD-10-CM | POA: Diagnosis not present

## 2015-10-23 DIAGNOSIS — S40862A Insect bite (nonvenomous) of left upper arm, initial encounter: Secondary | ICD-10-CM | POA: Diagnosis not present

## 2015-10-23 MED ORDER — HYDROCORTISONE 1 % EX CREA
TOPICAL_CREAM | Freq: Once | CUTANEOUS | Status: AC
  Administered 2015-10-23: 06:00:00 via TOPICAL
  Filled 2015-10-23: qty 28

## 2015-10-23 MED ORDER — DEXAMETHASONE SODIUM PHOSPHATE 10 MG/ML IJ SOLN
10.0000 mg | Freq: Once | INTRAMUSCULAR | Status: AC
  Administered 2015-10-23: 10 mg via INTRAMUSCULAR
  Filled 2015-10-23: qty 1

## 2015-10-23 MED ORDER — HYDROCORTISONE 1 % EX CREA: TOPICAL_CREAM | CUTANEOUS | Status: DC

## 2015-10-23 MED ORDER — DIPHENHYDRAMINE HCL 25 MG PO TABS: 25.0000 mg | ORAL_TABLET | Freq: Four times a day (QID) | ORAL | Status: DC

## 2015-10-23 NOTE — Discharge Instructions (Signed)
Insect Bite Mosquitoes, flies, fleas, bedbugs, and many other insects can bite. Insect bites are different from insect stings. A sting is when poison (venom) is injected into the skin. Insect bites can cause pain or itching for a few days, but they are usually not serious. Some insects can spread diseases to people through a bite. SYMPTOMS  Symptoms of an insect bite include:  Itching or pain in the bite area.  Redness and swelling in the bite area.  An open wound (skin ulcer). In many cases, symptoms last for 2-4 days.  DIAGNOSIS  This condition is usually diagnosed based on symptoms and a physical exam. TREATMENT  Treatment is usually not needed for an insect bite. Symptoms often go away on their own. Your health care provider may recommend creams or lotions to help reduce itching. Antibiotic medicines may be prescribed if the bite becomes infected. A tetanus shot may be given in some cases. If you develop an allergic reaction to an insect bite, your health care provider will prescribe medicines to treat the reaction (antihistamines). This is rare. HOME CARE INSTRUCTIONS  Do not scratch the bite area.  Keep the bite area clean and dry. Wash the bite area daily with soap and water as told by your health care provider.  If directed, applyice to the bite area.  Put ice in a plastic bag.  Place a towel between your skin and the bag.  Leave the ice on for 20 minutes, 2-3 times per day.  To help reduce itching and swelling, try applying a baking soda paste, cortisone cream, or calamine lotion to the bite area as told by your health care provider.  Apply or take over-the-counter and prescription medicines only as told by your health care provider.  If you were prescribed an antibiotic medicine, use it as told by your health care provider. Do not stop using the antibiotic even if your condition improves.  Keep all follow-up visits as told by your health care provider. This is  important. PREVENTION   Use insect repellent. The best insect repellents contain:  DEET, picaridin, oil of lemon eucalyptus (OLE), or IR3535.  Higher amounts of an active ingredient.  When you are outdoors, wear clothing that covers your arms and legs.  Avoid opening windows that do not have window screens. SEEK MEDICAL CARE IF:  You have increased redness, swelling, or pain in the bite area.  You have a fever. SEEK IMMEDIATE MEDICAL CARE IF:   You have joint pain.   You have fluid, blood, or pus coming from the bite area.  You have a headache or neck pain.  You have unusual weakness.  You have a rash.  You have chest pain or shortness of breath.  You have abdominal pain, nausea, or vomiting.  You feel unusually tired or sleepy.   This information is not intended to replace advice given to you by your health care provider. Make sure you discuss any questions you have with your health care provider.   Document Released: 05/10/2004 Document Revised: 12/22/2014 Document Reviewed: 08/18/2014 Elsevier Interactive Patient Education 2016 [employer]  

## 2015-10-23 NOTE — ED Notes (Signed)
Pt reports she thinks she may have been bit by a mosquito, noticed a small area of redness to the left upper forearm on Saturday morning. The area is now larger and itches. Applied neosporin prior to arrival.

## 2015-10-23 NOTE — ED Provider Notes (Signed)
CSN: [id number]     Arrival date & time 10/23/15  0213 History   First MD Initiated Contact with Patient 10/23/15 [phone]     Chief Complaint  Patient presents with  . Insect Bite     (Consider location/radiation/quality/duration/timing/severity/associated sxs/prior Treatment) HPI   Patient to the ER with complaints of a mosquito bite to her left upper arm. It started with a small bite on Friday night and when she woke up the area had gotten much larger. It has been very itchy and stings therefore she has been scratching it. She tried Neosporin and alcohol on the area, it is now much larger and red and she is concerned about what is causing the reaction.   She denies any systemic symptoms of N/V/D/ fevers or weakness. She has not had any numbness, tingling or weakness to the hand.  Past Medical History  Diagnosis Date  . Anxiety    Past Surgical History  Procedure Laterality Date  . Fracture surgery  2003    rod in ankle  . Cesarean section     No family history on file. Social History  Substance Use Topics  . Smoking status: Current Every Day Smoker -- 0.25 packs/day for 10 years    Types: Cigarettes  . Smokeless tobacco: None  . Alcohol Use: Yes     Comment: occassional   OB History    Gravida Para Term Preterm AB TAB SAB Ectopic Multiple Living   3 3 3             Review of Systems  Review of Systems All other systems negative except as documented in the HPI. All pertinent positives and negatives as reviewed in the HPI.   Allergies  Review of patient's allergies indicates no known allergies.  Home Medications   Prior to Admission medications   Medication Sig Start Date End Date Taking? Authorizing Provider  acetaminophen (TYLENOL) 325 MG tablet Take 650 mg by mouth every 6 (six) hours as needed for mild pain.    Historical Provider, MD  diphenhydrAMINE (BENADRYL) 25 MG tablet Take 1 tablet (25 mg total) by mouth every 6 (six) hours. 10/23/15   Delos Haring, PA-C    hydrocortisone cream 1 % Apply to affected area 2 times daily 10/23/15   Delos Haring, PA-C  methocarbamol (ROBAXIN) 500 MG tablet Take 1 tablet (500 mg total) by mouth 2 (two) times daily. 08/16/14   Jennifer Piepenbrink, PA-C  traMADol (ULTRAM) 50 MG tablet Take 1 tablet (50 mg total) by mouth every 6 (six) hours as needed. 08/16/14   Jennifer Piepenbrink, PA-C   BP 138/78 mmHg  Pulse 61  Temp(Src) 98.3 F (36.8 C) (Oral)  Resp 16  Ht 5\' 5"  (1.651 m)  Wt 105.32 kg  BMI 38.64 kg/m2  SpO2 100% Physical Exam  Constitutional: She appears well-developed and well-nourished. No distress.  HENT:  Head: Normocephalic and atraumatic.  Eyes: Pupils are equal, round, and reactive to light.  Neck: Normal range of motion. Neck supple.  Cardiovascular: Normal rate and regular rhythm.   Pulmonary/Chest: Effort normal.  Abdominal: Soft.  Neurological: She is alert.  Skin: Skin is warm and dry.     Nursing note and vitals reviewed.   ED Course  Procedures (including critical care time) Labs Review Labs Reviewed - No data to display  Imaging Review No results found. I have personally reviewed and evaluated these images and lab results as part of my medical decision-making.   EKG Interpretation None  MDM   Final diagnoses:  Mosquito bite    No signs of infection or systemic involvement at todays visit. Gave a one time dose of steroid (Decadron) and rx Benadryl and Hydrocortisone cream.    Follow up with PCP or UC in 2-3 days. Return precautions discussed. Pt is safe for discharge at this time. .  EdBlood pressure 138/78, pulse 61, temperature 98.3 F (36.8 C), temperature source Oral, resp. rate 16, height 5\' 5"  (1.651 m), weight 105.32 kg, SpO2 100 %.         Delos Haring, PA-C 10/23/15 [id number]  Charlesetta Shanks, MD 10/25/15 [phone]

## 2015-10-23 NOTE — ED Notes (Signed)
Patient verbalized understanding of discharge instructions and denies any further needs or questions at this time. VS stable. Patient ambulatory with steady gait.  

## 2016-01-12 ENCOUNTER — Encounter: Payer: Self-pay | Admitting: Family Medicine

## 2016-01-12 ENCOUNTER — Ambulatory Visit (INDEPENDENT_AMBULATORY_CARE_PROVIDER_SITE_OTHER): Payer: BLUE CROSS/BLUE SHIELD | Admitting: Family Medicine

## 2016-01-12 ENCOUNTER — Encounter: Payer: Self-pay | Admitting: Gastroenterology

## 2016-01-12 VITALS — BP 120/90 | HR 62 | Temp 98.1°F | Resp 12 | Ht 65.0 in | Wt 218.1 lb

## 2016-01-12 DIAGNOSIS — R202 Paresthesia of skin: Secondary | ICD-10-CM | POA: Diagnosis not present

## 2016-01-12 DIAGNOSIS — M2550 Pain in unspecified joint: Secondary | ICD-10-CM

## 2016-01-12 DIAGNOSIS — R200 Anesthesia of skin: Secondary | ICD-10-CM

## 2016-01-12 DIAGNOSIS — R1030 Lower abdominal pain, unspecified: Secondary | ICD-10-CM

## 2016-01-12 DIAGNOSIS — D508 Other iron deficiency anemias: Secondary | ICD-10-CM

## 2016-01-12 DIAGNOSIS — Z1211 Encounter for screening for malignant neoplasm of colon: Secondary | ICD-10-CM

## 2016-01-12 DIAGNOSIS — M545 Low back pain: Secondary | ICD-10-CM

## 2016-01-12 LAB — COMPREHENSIVE METABOLIC PANEL
ALBUMIN: 3.8 g/dL (ref 3.5–5.2)
ALT: 10 U/L (ref 0–35)
AST: 14 U/L (ref 0–37)
Alkaline Phosphatase: 74 U/L (ref 39–117)
BUN: 12 mg/dL (ref 6–23)
CHLORIDE: 104 meq/L (ref 96–112)
CO2: 30 meq/L (ref 19–32)
CREATININE: 0.64 mg/dL (ref 0.40–1.20)
Calcium: 9 mg/dL (ref 8.4–10.5)
GFR: 126.25 mL/min (ref >60.00)
Glucose, Bld: 74 mg/dL (ref 70–99)
Potassium: 4.3 mEq/L (ref 3.5–5.1)
Sodium: 140 mEq/L (ref 135–145)
Total Bilirubin: 0.4 mg/dL (ref 0.2–1.2)
Total Protein: 6.8 g/dL (ref 6.0–8.3)

## 2016-01-12 LAB — HEMOGLOBIN A1C: Hgb A1c MFr Bld: 5.6 % (ref 4.6–6.5)

## 2016-01-12 LAB — CBC
HCT: 43.5 % (ref 36.0–46.0)
HEMOGLOBIN: 14.4 g/dL (ref 12.0–15.0)
MCHC: 33.2 g/dL (ref 30.0–36.0)
MCV: 86.7 fl (ref 78.0–100.0)
PLATELETS: 219 10*3/uL (ref 150.0–400.0)
RBC: 5.01 Mil/uL (ref 3.87–5.11)
RDW: 14.6 % (ref 11.5–15.5)
WBC: 6.3 10*3/uL (ref 4.0–10.5)

## 2016-01-12 LAB — FERRITIN: Ferritin: 28 ng/mL (ref 10.0–291.0)

## 2016-01-12 LAB — TSH: TSH: 1.17 u[IU]/mL (ref 0.35–4.50)

## 2016-01-12 NOTE — Patient Instructions (Addendum)
A few things to remember from today's visit:   Numbness and tingling - Plan: CMP, Methylmalonic Acid, Serum, TSH, Hemoglobin A1c, MR Lumbar Spine Wo Contrast, HIV antibody (with reflex)  ANEMIA, IRON DEFICIENCY - Plan: CBC, Ferritin, Ambulatory referral to Gastroenterology  Arthralgia of multiple sites  Bilateral low back pain, with sciatica presence unspecified - Plan: MR Lumbar Spine Wo Contrast  Colon cancer screening - Plan: Ambulatory referral to Gastroenterology  What are some tips for weight loss? People become overweight for many reasons. Weight issues can run in families. They can be caused by unhealthy behaviors and a person's environment. Certain health problems and medicines can also lead to weight gain. There are some simple things you can do to reach and maintain a healthy weight:  Eat small more frequent healthy meals instead 3 bid meals. Also Weight Watchers is a good option. Avoid sweet drinks. These include regular soft drinks, fruit juices, fruit drinks, energy drinks, sweetened iced tea, and flavored milk. Avoid fast foods. Fast foods such as french fries, hamburgers, chicken nuggets, and pizza are high in calories and can cause weight gain. Eat a healthy breakfast. People who skip breakfast tend to weigh more. Don't watch more than two hours of television per day. Chew sugar-free gum between meals to cut down on snacking. Avoid grocery shopping when you're hungry. Pack a healthy lunch instead of eating out to control what and how much you eat. Eat a lot of fruits and vegetables. Aim for about 2 cups of fruit and 2 to 3 cups of vegetables per day. Aim for 150 minutes per week of moderate-intensity exercise (such as brisk walking), or 75 minutes per week of vigorous exercise (such as jogging or running). OR 15-30 min of daily brisk walking. Be more active. Small changes in physical activity can easily be added to your daily routine. For example, take the stairs instead of  the elevator. Take a walk with your family. A daily walk is a great way to get exercise and to catch up on the day's events.  Marijuana increases appetite.   Back pain is very common in adults.The cause of back pain is rarely dangerous and the pain often gets better over time even with no pharmacologic treatment.  The cause of your back pain may not be known. Some common causes of back pain include: 1. Strain of the muscles or ligaments supporting the spine. 2. Wear and tear (degeneration) of the spinal disks. 3. Arthritis. 4. Direct injury to the back. 5.  For many people, back pain may return. Since back pain is rarely dangerous, most people can learn to manage this condition on their own.  HOME CARE INSTRUCTIONS Watch your back pain for any changes. The following actions may help to lessen any discomfort you are feeling: 1. Remain active. It is stressful on your back to sit or stand in one place for long periods of time. Do not sit, drive, or stand in one place for more than 30 minutes at a time. Take short walks on even surfaces as soon as you are able.Try to increase the length of time you walk each day. 2.  3. Exercise regularly as directed by your health care provider. Exercise helps your back heal faster. It also helps avoid future injury by keeping your muscles strong and flexible.  4. Do not stay in bed.Resting more than 1-2 days can delay your recovery.  5. Pay attention to your body when you bend and lift. The most comfortable positions are those that put less stress on your recovering back.  6.  Always use proper lifting techniques, including: 1. Bending your knees. 2. Keeping the load close to your body. 3. Avoiding twisting.  7. Find a comfortable position to sleep. Use a firm mattress and lie on your side with your knees slightly bent. If you lie on your back, put a pillow under your knees.  8. Over the counter  rubbing medications like Icy Hot or local heat might help.  Acetaminophen and/or Aleve/Ibuprofen can be taken if needed and if not contraindications. Local ice and heat may be alternated to reduce pain and spasms.     Muscle relaxants might or might not help, they cause drowsiness among other    side effects. They could also interact with some of medications you may be already taking (medications for depression/anxiety and some pain medications).   9. Maintain a healthy weight. Excess weight puts extra stress on your back and makes it difficult to maintain good posture.   SEEK MEDICAL CARE IF: worsening pain, associated fever, rash/edema on area, pain going to legs or buttocks, numbness/tingling, night pain, or abnormal weight loss.    SEEK IMMEDIATE MEDICAL CARE IF:  1. You develop new bowel or bladder control problems. 2. You have unusual weakness or numbness in your arms or legs. 3. You develop nausea or vomiting. 4. You develop abdominal pain. 5. You feel faint.     Back Exercises The following exercises strengthen the muscles that help to support the back. They also help to keep the lower back flexible. Doing these exercises can help to prevent back pain or lessen existing pain. If you have back pain or discomfort, try doing these exercises 2-3 times each day or as told by your health care provider. When the pain goes away, do them once each day, but increase the number of times that you repeat the steps for each exercise (do more repetitions). If you do not have back pain or discomfort, do these exercises once each day or as told by your health care provider.   EXERCISES Single Knee to Chest Repeat these steps 3-5 times for each leg: 6. Lie on your back on a firm bed or the floor with your legs extended. 7. Bring one knee to your chest. Your other leg should stay extended and in contact with the floor. 8. Hold your knee in place by grabbing your knee or thigh. 9. Pull on your knee  until you feel a gentle stretch in your lower back. 10. Hold the stretch for 10-30 seconds. 11. Slowly release and straighten your leg.  Pelvic Tilt Repeat these steps 5-10 times: 10. Lie on your back on a firm bed or the floor with your legs extended. [place] your knees so they are pointing toward the ceiling and your feet are flat on the floor. 12. Tighten your lower abdominal muscles to press your lower back against the floor. This motion will tilt your pelvis so your tailbone points up toward the ceiling instead of pointing to your feet or the floor. 13. With gentle tension and even breathing, hold this position for 5-10 seconds.  Cat-Cow Repeat these steps until your lower back becomes more flexible: 1. Get into a hands-and-knees position on a firm surface. Keep your hands under your shoulders, and keep your knees under your hips. You may place padding under your knees for  comfort. 2. Let your head hang down, and point your tailbone toward the floor so your lower back becomes rounded like the back of a cat. 3. Hold this position for 5 seconds. 4. Slowly lift your head and point your tailbone up toward the ceiling so your back forms a sagging arch like the back of a cow. 5. Hold this position for 5 seconds.   Press-Ups Repeat these steps 5-10 times: 6. Lie on your abdomen (face-down) on the floor. 7. Place your palms near your head, about shoulder-width apart. 8. While you keep your back as relaxed as possible and keep your hips on the floor, slowly straighten your arms to raise the top half of your body and lift your shoulders. Do not use your back muscles to raise your upper torso. You may adjust the placement of your hands to make yourself more comfortable. 9. Hold this position for 5 seconds while you keep your back relaxed. 10. Slowly return to lying flat on the floor.   Bridges Repeat these steps 10 times: 1. Lie on your back on a firm surface. 2. Bend your knees so they  are pointing toward the ceiling and your feet are flat on the floor. 3. Tighten your buttocks muscles and lift your buttocks off of the floor until your waist is at almost the same height as your knees. You should feel the muscles working in your buttocks and the back of your thighs. If you do not feel these muscles, slide your feet 1-2 inches farther away from your buttocks. 4. Hold this position for 3-5 seconds. 5. Slowly lower your hips to the starting position, and allow your buttocks muscles to relax completely. If this exercise is too easy, try doing it with your arms crossed over your chest.   Monitor blood pressure at home.  Please schedule a physical for your pap smear and preventive care.  Please be sure medication list is accurate. If a new problem present, please set up appointment sooner than planned today.

## 2016-01-12 NOTE — Progress Notes (Signed)
HPI:   [name] is a 50 y.o. female, who is here today to establish care with me.  Former PCP: N/A Last preventive routine visit: 2010.   Concerns today: Numbness and burning, back pain.  According to patient, since June 2017 she has had constant numbness and burning sensation on lower extremities, mainly on anterior aspect of thighs. She is also complaining of same sensation on upper extremities but involving the whole arm. She denies any cervical pain but has stiffness, she has had intermittent lower back pain, occasionally radiated to left lower extremity. According to patient, she has had lower back pain for about 2-3 months, she denies any history of trauma.  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 Occasional right temporal headache, no severe and no associated visual changes or photophobia.  Cervical/brain CT 08/2014:  1. Stable and normal noncontrast CT appearance of the brain. 2. No acute fracture or listhesis identified in the cervical spine. Ligamentous injury is not excluded. 3. 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 Superimposed multilevel disc and endplate degeneration.   She also has intermittent arthralgias, mainly right shoulder and hips, no limitation of range of motion.  Pain is "bad", sharp, exacerbated by movement, standing, and movement after prolonged rest. Relived with changing position or being "still." She denies any prior workup.  She takes Ibuprofen OTC to treat arthralgias and back pain. In the past she took muscle relaxants for cervical pain, prescription received from the ER.  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  She also mentions periumbilical and lower abdominal pain, intermittently, for a while,  she has not identified exacerbating factors or alleviating factors but seems to be slightly worse with menstrual periods. She denies any associated nausea, vomiting, changes in bowel habits, or blood in stool. But she mentions "black" stools intermittently for "a minute."  Denies nausea, vomiting, changes in bowel habits, or red blood in stool.  Abd/pelvic CT 02/2014:  Normal appendix. No renal or ureteral stones. No acute findings in the abdomen or pelvis.  Transvaginal U/S 02/2014:  Two uterine fibroids, measuring up to 2.6 cm, as above. Otherwise negative pelvic ultrasound.  Abdominal U/S 10/2009:  1.  Contracted gallbladder which may be physiological, since the patient was not fasting for this exam.  2.  Probable steatosis of the liver. 3.  No other acute or significant findings.   She has history of iron deficiency anemia, currently she is not on iron supplementation. She has not had colonoscopy done before. Denies FHx for colon cancer.  LMP 01/11/16 after 2 month with no menses.  She denies urinary symptoms or vaginal discharge.  - Reporting Hx of depression and anxiety for about a year, she is not on treatment. She denies Hx of severe symptoms or suicidal thoughts. Insomnia, which is chronic, she states that she is "up and down" all night.  She has not seen a psychiatrist in the past and denies any hospital admission due to psychiatry disorder. FHx negative for psychiatric problems. She lives alone.  + Smoker.  She also mentions wt gain, not sure how much. She doe snot exercise regularly and is not consistent with a healthy diet. + Fatigue.   Review of Systems  Constitutional: Positive for fatigue and unexpected weight  change. Negative for activity change, appetite change, diaphoresis and fever.  HENT: Negative for congestion, facial swelling, mouth sores, nosebleeds, sore throat, trouble swallowing and voice change.   Eyes: Negative for photophobia, redness and  visual disturbance.  Respiratory: Negative for cough, shortness of breath and wheezing.   Cardiovascular: Negative for chest pain, palpitations and leg swelling.  Gastrointestinal: Negative for abdominal pain, blood in stool, nausea and vomiting.       Negative for changes in bowel habits.  Endocrine: Negative for cold intolerance, heat intolerance, polydipsia, polyphagia and polyuria.  Genitourinary: Positive for pelvic pain. Negative for decreased urine volume, difficulty urinating, dysuria, frequency, genital sores, hematuria and vaginal discharge.  Musculoskeletal: Positive for arthralgias, back pain and neck stiffness. Negative for gait problem and neck pain.  Skin: Negative for rash and wound.  Neurological: Positive for numbness and headaches. Negative for tremors, seizures, syncope, facial asymmetry, speech difficulty and weakness.  Hematological: Negative for adenopathy. Does not bruise/bleed easily.  Psychiatric/Behavioral: Positive for sleep disturbance. Negative for hallucinations and suicidal ideas. The patient is nervous/anxious.       Current Outpatient Prescriptions on File Prior to Visit  Medication Sig Dispense Refill  . hydrocortisone cream 1 % Apply to affected area 2 times daily 15 g 0   No current facility-administered medications on file prior to visit.      Past Medical History:  Diagnosis Date  . Anxiety   . Depression   . Hypertension    No Known Allergies  Family History  Problem Relation Age of Onset  . Cancer Father   . Hypertension Sister   . Hypertension Brother   . Diabetes Maternal Uncle     Social History   Social History  . Marital status: Married    Spouse name: N/A  . Number of children: N/A  . Years of education: N/A   Social History Main Topics  . Smoking status: Current Every Day Smoker    Packs/day: 0.25    Years: 10.00    Types: Cigarettes  . Smokeless tobacco: None  . Alcohol use Yes     Comment: occassional  . Drug  use:     Frequency: 7.0 times per week    Types: Marijuana  . Sexual activity: Yes    Birth control/ protection: None, Surgical     Comment: tubes tied   Other Topics Concern  . None   Social History Narrative  . None    Vitals:   01/12/16 0902  BP: 120/90  Pulse: 62  Resp: 12  Temp: 98.1 F (36.7 C)   O2 sat 98% at RA.  Body mass index is 36.3 kg/m.    Physical Exam  Nursing note and vitals reviewed. Constitutional: She is oriented to person, place, and time. She appears well-developed. She does not appear ill. No distress.  HENT:  Head: Atraumatic.  Mouth/Throat: Oropharynx is clear and moist and mucous membranes are normal.  Eyes: Conjunctivae and EOM are normal. Pupils are equal, round, and reactive to light.  Neck: Normal range of motion. No thyroid mass and no thyromegaly present.  Cardiovascular: Normal rate and regular rhythm.   No murmur heard. Respiratory: Effort normal and breath sounds normal. No respiratory distress.  GI: Soft. Bowel sounds are normal. She exhibits no mass. There is no hepatomegaly. There is no tenderness.  Musculoskeletal: She exhibits no edema.       Cervical back: She exhibits normal range of motion and no bony tenderness.  + tenderness upon  palpation of lumbar paraspinal muscles, bilateral at L3-4. Pain upon palpation of right trapezium, + muscle contracture. Pain elicited with movement on exam table during examination. Right shoulder pain with ROM, empty can test elicits pain, negative impingement and Hawkins'. No local edema or erythema appreciated, no suspicious lesions.    Lymphadenopathy:    She has no cervical adenopathy.  Neurological: She is alert and oriented to person, place, and time. She has normal strength. No cranial nerve deficit or sensory deficit. Coordination normal.  Reflex Scores:      Patellar reflexes are 2+ on the right side and 2+ on the left side.      Achilles reflexes are 2+ on the right side and 2+ on  the left side. SLR negative bilateral. Mild antalgic gait when she first gets up from chair, otherwise normal.  Skin: Skin is warm. No rash noted. No erythema.  Psychiatric: Her speech is normal. She expresses no suicidal ideation. She expresses no suicidal plans.  Well groomed, poor eye contact. Flat affect.      ASSESSMENT AND PLAN:     Elizabeth Whitney was seen today for new patient (initial visit).  Diagnoses and all orders for this visit:   Lab Results  Component Value Date   WBC 6.3 01/12/2016   HGB 14.4 01/12/2016   HCT 43.5 01/12/2016   MCV 86.7 01/12/2016   PLT 219.0 01/12/2016     Chemistry      Component Value Date/Time   NA 140 01/12/2016 1008   K 4.3 01/12/2016 1008   CL 104 01/12/2016 1008   CO2 30 01/12/2016 1008   BUN 12 01/12/2016 1008   CREATININE 0.64 01/12/2016 1008      Component Value Date/Time   CALCIUM 9.0 01/12/2016 1008   ALKPHOS 74 01/12/2016 1008   AST 14 01/12/2016 1008   ALT 10 01/12/2016 1008   BILITOT 0.4 01/12/2016 1008     Lab Results  Component Value Date   TSH 1.17 01/12/2016    Lab Results  Component Value Date   HGBA1C 5.6 01/12/2016    Numbness and tingling  Upper and lower extremities. Upper extremities does not follow specific dermatoma. She has had cervical and head CT in the past. Some labs done today, further recommendations will be given accordingly.   -     CMP -     Methylmalonic Acid, Serum -     TSH -     Hemoglobin A1c -     MR Lumbar Spine Wo Contrast; Future -     HIV antibody (with reflex)  ANEMIA, IRON DEFICIENCY  Further recommendations will be given according to lab results. Hx of fibroids, which could be the main etiology.   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 Caution with NSAID's. May consider Cymbalta in future visits and according to lab results. Low impact exercise and wt loss may  help.  Bilateral low back pain, with sciatica presence unspecified  Lumbar MRI will be arranged. Instructed about warning signs. May consider recommending Cymbalta or Gabapentin as well as PT or ortho depending on MRI results.   -     MR Lumbar Spine Wo Contrast; Future  Colon cancer screening -     Ambulatory referral to Gastroenterology  Lower abdominal pain  Seems chronic. Today examination did not elicit pain and no evident abnormalities identified.  Possible causes discussed. She has had some imaging done in the past and no clear cause identified, ? Fibroid, this could explained pelvic pain.  ? IBS, gyn among some. Urine was not collected, so will consider next OV. Instructed about warning signs. Some labs done today, further recommendations will be given accordingly.        -Refused Influenza vaccination. -Adverse effects of marijuana use discussed, including wt gain. -She needs a routine preventive visit, instructed to arranged.          Casady Voshell G. Martinique, MD  [facility]. [place] office.

## 2016-01-13 ENCOUNTER — Other Ambulatory Visit (INDEPENDENT_AMBULATORY_CARE_PROVIDER_SITE_OTHER): Payer: BLUE CROSS/BLUE SHIELD

## 2016-01-13 DIAGNOSIS — E559 Vitamin D deficiency, unspecified: Secondary | ICD-10-CM | POA: Diagnosis not present

## 2016-01-13 LAB — VITAMIN B12: VITAMIN B 12: 448 pg/mL (ref 211–911)

## 2016-01-13 LAB — HIV ANTIBODY (ROUTINE TESTING W REFLEX): HIV 1&2 Ab, 4th Generation: NONREACTIVE

## 2016-01-15 LAB — METHYLMALONIC ACID, SERUM: Methylmalonic Acid, Quant: <50 nmol/L — ABNORMAL LOW (ref 87–318)

## 2016-03-19 ENCOUNTER — Ambulatory Visit: Payer: BLUE CROSS/BLUE SHIELD | Admitting: Gastroenterology

## 2016-03-19 ENCOUNTER — Other Ambulatory Visit: Payer: Self-pay

## 2016-03-23 ENCOUNTER — Encounter: Payer: Self-pay | Admitting: Family Medicine

## 2016-04-13 ENCOUNTER — Ambulatory Visit: Payer: BLUE CROSS/BLUE SHIELD | Admitting: Family Medicine

## 2016-05-15 ENCOUNTER — Encounter: Payer: Self-pay | Admitting: Family Medicine

## 2016-05-15 ENCOUNTER — Ambulatory Visit (INDEPENDENT_AMBULATORY_CARE_PROVIDER_SITE_OTHER): Payer: BLUE CROSS/BLUE SHIELD | Admitting: Family Medicine

## 2016-05-15 VITALS — BP 122/80 | HR 72 | Resp 12 | Ht 65.0 in | Wt 211.0 lb

## 2016-05-15 DIAGNOSIS — M542 Cervicalgia: Secondary | ICD-10-CM | POA: Diagnosis not present

## 2016-05-15 DIAGNOSIS — R209 Unspecified disturbances of skin sensation: Secondary | ICD-10-CM

## 2016-05-15 DIAGNOSIS — M549 Dorsalgia, unspecified: Secondary | ICD-10-CM

## 2016-05-15 DIAGNOSIS — F32A Depression, unspecified: Secondary | ICD-10-CM | POA: Insufficient documentation

## 2016-05-15 DIAGNOSIS — F418 Other specified anxiety disorders: Secondary | ICD-10-CM

## 2016-05-15 DIAGNOSIS — G8929 Other chronic pain: Secondary | ICD-10-CM | POA: Diagnosis not present

## 2016-05-15 DIAGNOSIS — F329 Major depressive disorder, single episode, unspecified: Secondary | ICD-10-CM

## 2016-05-15 DIAGNOSIS — F419 Anxiety disorder, unspecified: Secondary | ICD-10-CM

## 2016-05-15 LAB — URINALYSIS, ROUTINE W REFLEX MICROSCOPIC
BILIRUBIN URINE: NEGATIVE
Hgb urine dipstick: NEGATIVE
KETONES UR: NEGATIVE
LEUKOCYTES UA: NEGATIVE
Nitrite: NEGATIVE
Specific Gravity, Urine: 1.025 (ref 1.000–1.030)
TOTAL PROTEIN, URINE-UPE24: NEGATIVE
Urine Glucose: NEGATIVE
Urobilinogen, UA: 1 (ref 0.0–1.0)
pH: 6.5 (ref 5.0–8.0)

## 2016-05-15 MED ORDER — BACLOFEN 10 MG PO TABS: 10.0000 mg | ORAL_TABLET | Freq: Every evening | ORAL | 1 refills | Status: DC | PRN

## 2016-05-15 MED ORDER — DULOXETINE HCL 30 MG PO CPEP: 30.0000 mg | ORAL_CAPSULE | Freq: Every day | ORAL | 1 refills | Status: DC

## 2016-05-15 NOTE — Progress Notes (Signed)
Pre visit review using our clinic review tool, if applicable. No additional management support is needed unless otherwise documented below in the visit note. 

## 2016-05-15 NOTE — Patient Instructions (Addendum)
  A few things to remember from today's visit:   Cervicalgia - Plan: baclofen (LIORESAL) 10 MG tablet  Anxiety and depression - Plan: DULoxetine (CYMBALTA) 30 MG capsule  Chronic bilateral back pain, unspecified back location - Plan: DULoxetine (CYMBALTA) 30 MG capsule  Tai Chi may help.   Please be sure medication list is accurate. If a new problem present, please set up appointment sooner than planned today.   Please let me know in 4-6 weeks how you are doing with medications.  Splints on wrist at night for carpal tunnel synd  Please re-schedule appointment with gastro:   [facility]  Address: [address], [place], [place] [address]  [phone]

## 2016-05-15 NOTE — Progress Notes (Signed)
HPI:   [name] is a 51 y.o. female, who is here today to follow on some chronic medical problems, she was last seen here in the office 01/12/16.  Hx of lower back pain, radiated to left LE. Numbness and burning sensation on anterior aspect of thigh, bilateral, and stable.  Lumbar MRI was ordered last OV, missed appt.   + Arthralgias, mainly shoulders and hips. No edema or erythema. No limitation of daily activities. Pain is mild-moderate,can be "bad", exacerbated by prolonged standing or when getting up after prolonged sitting, + stiffness.  + Cervical pain, no radiated, chronic.  Cervical CT w/o contrast 08/2014 degenerative changes.  Lab Results  Component Value Date   CREATININE 0.64 01/12/2016   BUN 12 01/12/2016   NA 140 01/12/2016   K 4.3 01/12/2016   CL 104 01/12/2016   CO2 30 01/12/2016   Hx of depression and insomnia. Denies suicidal thoughts. Hx of illicit drug use, still uses marijuana occasionally.   -6-12 months of bilateral hand (palms) numbness and tingling, at night usually, relieved by shaking hands.  -For a year or so skin lesions on buttocks, she "picks on them" frequently causing irritation. Accoridng to pt, she had lesion evaluated when she had her gyn examination at the [facility] and was told they were acne.    -She is also requesting a note for work, last night she could not work because 2 days of diarrhea.Symptoms have resolved.  -She was also c/o lower abdominal pain, which has improved.She was referred to gastro , missed appt with and has not re-scheduled appt.  She denies dysuria, gross hematuria, or decreased urine output.   Review of Systems  Constitutional: Positive for fatigue. Negative for appetite change, diaphoresis and fever.  HENT: Negative for mouth sores, nosebleeds and trouble swallowing.   Eyes: Negative for pain and visual disturbance.  Respiratory: Negative for cough, shortness of breath and wheezing.     Cardiovascular: Negative for chest pain, palpitations and leg swelling.  Gastrointestinal: Negative for blood in stool, nausea and vomiting.       Negative for changes in bowel habits.  Genitourinary: Negative for decreased urine volume, hematuria and vaginal discharge.  Musculoskeletal: Positive for arthralgias, back pain and neck pain. Negative for gait problem.  Skin: Negative for rash.  Neurological: Positive for numbness. Negative for syncope, weakness and headaches.  Psychiatric/Behavioral: Positive for sleep disturbance. Negative for hallucinations and suicidal ideas. The patient is nervous/anxious.       No current outpatient prescriptions on file prior to visit.   No current facility-administered medications on file prior to visit.      Past Medical History:  Diagnosis Date  . Anxiety   . Depression   . Hypertension    No Known Allergies  Social History   Social History  . Marital status: Married    Spouse name: N/A  . Number of children: N/A  . Years of education: N/A   Social History Main Topics  . Smoking status: Current Every Day Smoker    Packs/day: 0.25    Years: 10.00    Types: Cigarettes  . Smokeless tobacco: Never Used  . Alcohol use Yes     Comment: occassional  . Drug use: Yes    Frequency: 7.0 times per week    Types: Marijuana  . Sexual activity: Yes    Birth control/ protection: None, Surgical     Comment: tubes tied   Other Topics Concern  .  None   Social History Narrative  . None    Vitals:   05/15/16 1324  BP: 122/80  Pulse: 72  Resp: 12   Body mass index is 35.11 kg/m.   Physical Exam  Nursing note and vitals reviewed. Constitutional: She is oriented to person, place, and time. She appears well-developed. She does not appear ill. No distress.  HENT:  Head: Atraumatic.  Mouth/Throat: Oropharynx is clear and moist and mucous membranes are normal.  Eyes: Conjunctivae and EOM are normal.  Cardiovascular: Normal rate and  regular rhythm.   No murmur heard. Respiratory: Effort normal and breath sounds normal. No respiratory distress.  GI: Soft. She exhibits no mass. There is no hepatomegaly. There is no tenderness.  Musculoskeletal: She exhibits no edema.  Tinel and Phalen negative bilateral. No limitation of ROM of wrists. No signs of synovitis or major deformities appreciated.  Mild right trapezium tenderness and spasm.  + Tenderness upon palpation of paraspinal muscles, lumbar, bilateral.  Lymphadenopathy:    She has no cervical adenopathy.  Neurological: She is alert and oriented to person, place, and time. She has normal strength. Coordination and gait normal.  SLR negative bilateral.  Skin: Skin is warm. No rash noted. No erythema.  Psychiatric:  Flat affect, adequate groomed, good eye contact.      ASSESSMENT AND PLAN:   Elizabeth Whitney was seen today for follow-up.  Diagnoses and all orders for this visit:   Chronic bilateral back pain, unspecified back location  She agrees with trying Cymbalta, 30 mg started. Some side effects discussed. F/U in 3 months but she was instructed to let me know in 4-6 weeks how she was tolerating medication.  -     DULoxetine (CYMBALTA) 30 MG capsule; Take 1 capsule (30 mg total) by mouth daily. -     Urinalysis, Routine w reflex microscopic  Cervicalgia  ROM exercises, local heat. Baclofen side effects discussed. She can take it at bedtime. F/U in 3 months.  -     baclofen (LIORESAL) 10 MG tablet; Take 1 tablet (10 mg total) by mouth at bedtime as needed for muscle spasms.  Anxiety and depression  Cymbalta 30 mg started today. Instructed about warning signs. F/U in 3 months, before if needed.  -     DULoxetine (CYMBALTA) 30 MG capsule; Take 1 capsule (30 mg total) by mouth daily.  Skin sensation disturbance  Hands symptoms suggest carpal tunnel synd, night wrist splints recommended. LE, on tights: Radicular pain. Symptoms are chronic and  stable. Blood work done last OV otherwise negative. Cymbalta 30 mg started.  HIV NR.  Lab Results  Component Value Date   [id number] 01/13/2016     Chemistry      Component Value Date/Time   NA 140 01/12/2016 1008   K 4.3 01/12/2016 1008   CL 104 01/12/2016 1008   CO2 30 01/12/2016 1008   BUN 12 01/12/2016 1008   CREATININE 0.64 01/12/2016 1008      Component Value Date/Time   CALCIUM 9.0 01/12/2016 1008   ALKPHOS 74 01/12/2016 1008   AST 14 01/12/2016 1008   ALT 10 01/12/2016 1008   BILITOT 0.4 01/12/2016 1008     Lab Results  Component Value Date   TSH 1.17 01/12/2016     She was instructed to re-scheduled appt with GI.    -Ms. Elizabeth Whitney was advised to return sooner than planned today if new concerns arise.       Sravya Grissom G. Martinique,  MD  [facility]. [place] office.

## 2016-08-13 ENCOUNTER — Ambulatory Visit: Payer: BLUE CROSS/BLUE SHIELD | Admitting: Family Medicine

## 2016-10-27 IMAGING — DX DG SACRUM/COCCYX 2+V
3 series · 3 of 3 positions shown · non-contrast
Comparison: None.

CLINICAL DATA: Fell backwards on concrete.  sacrum coccyx pain.

EXAM:
SACRUM AND COCCYX - 2+ VIEW

[sacrum ap]
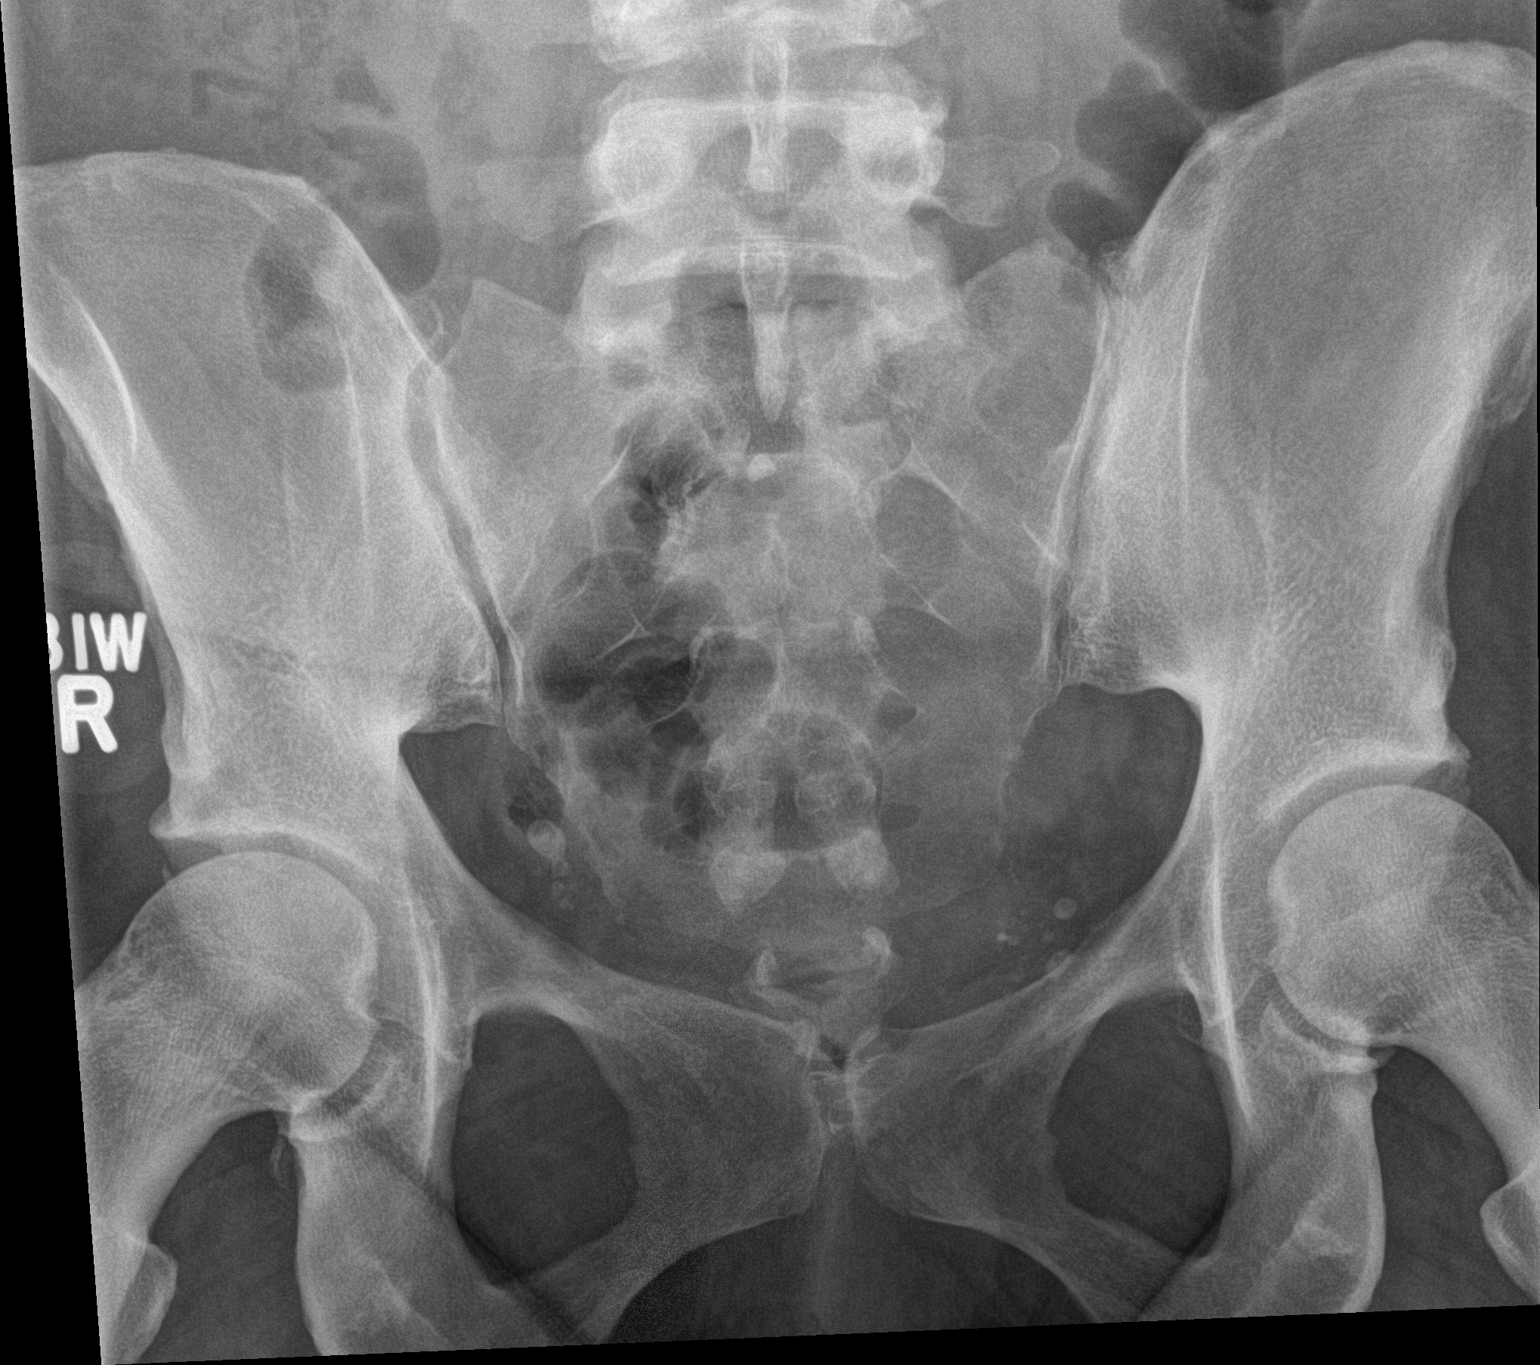

[coccyx ap]
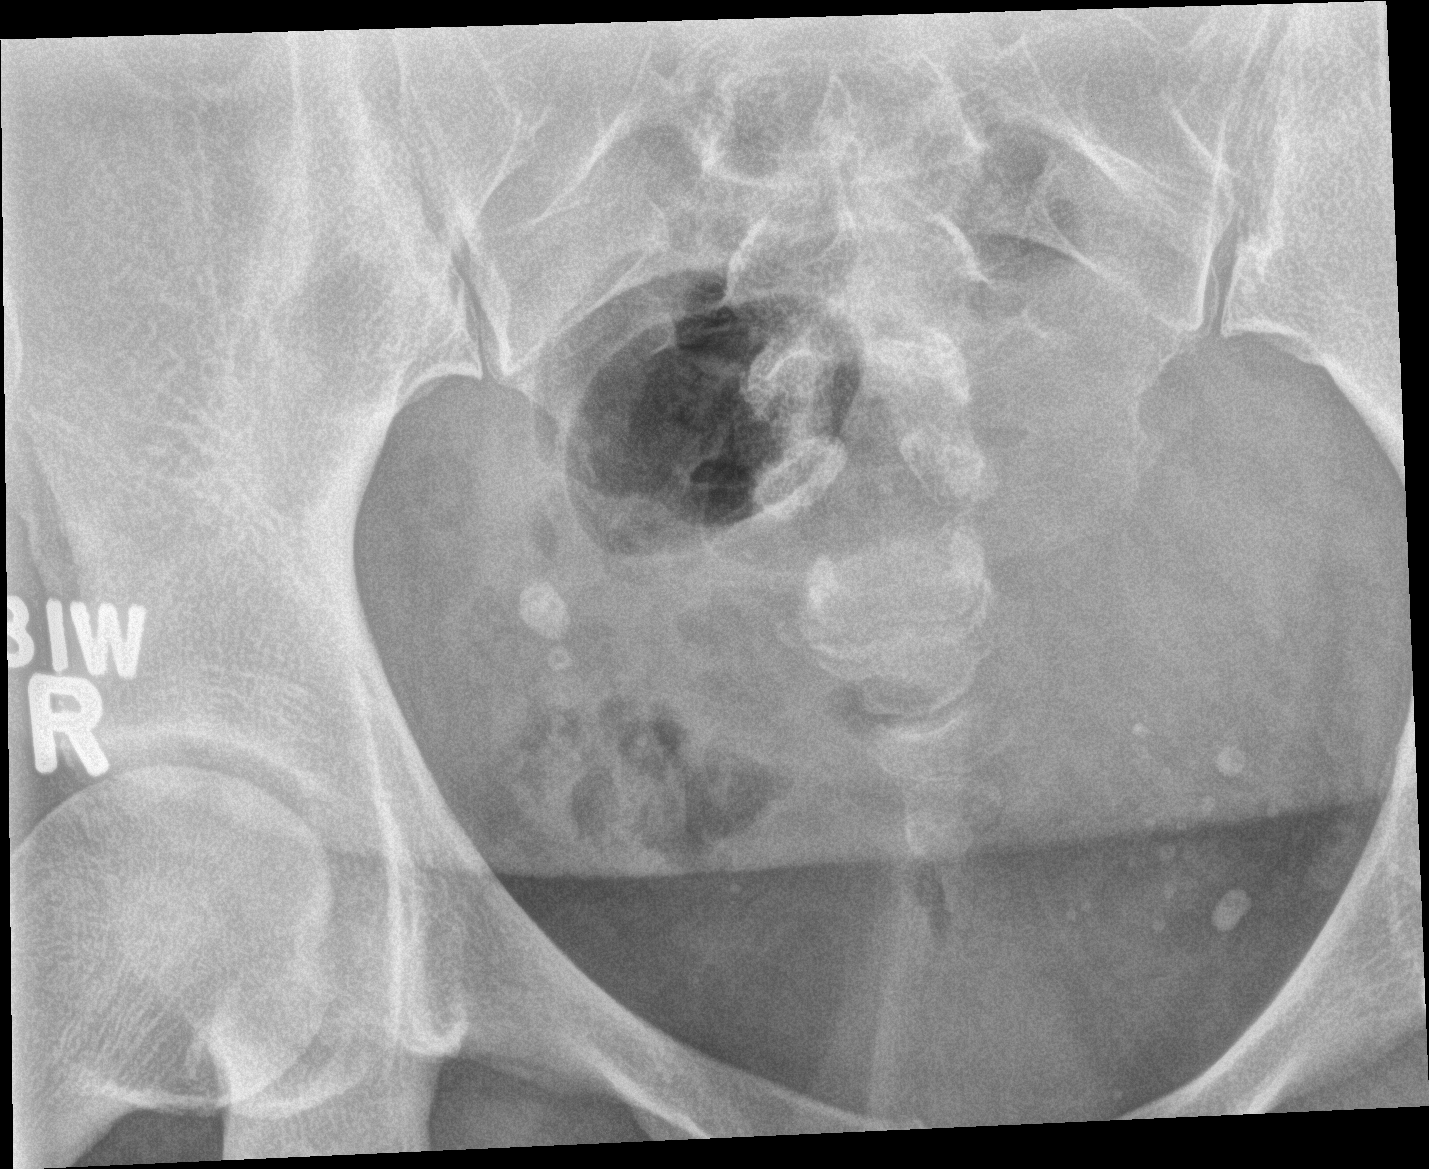

[sacrum lat]
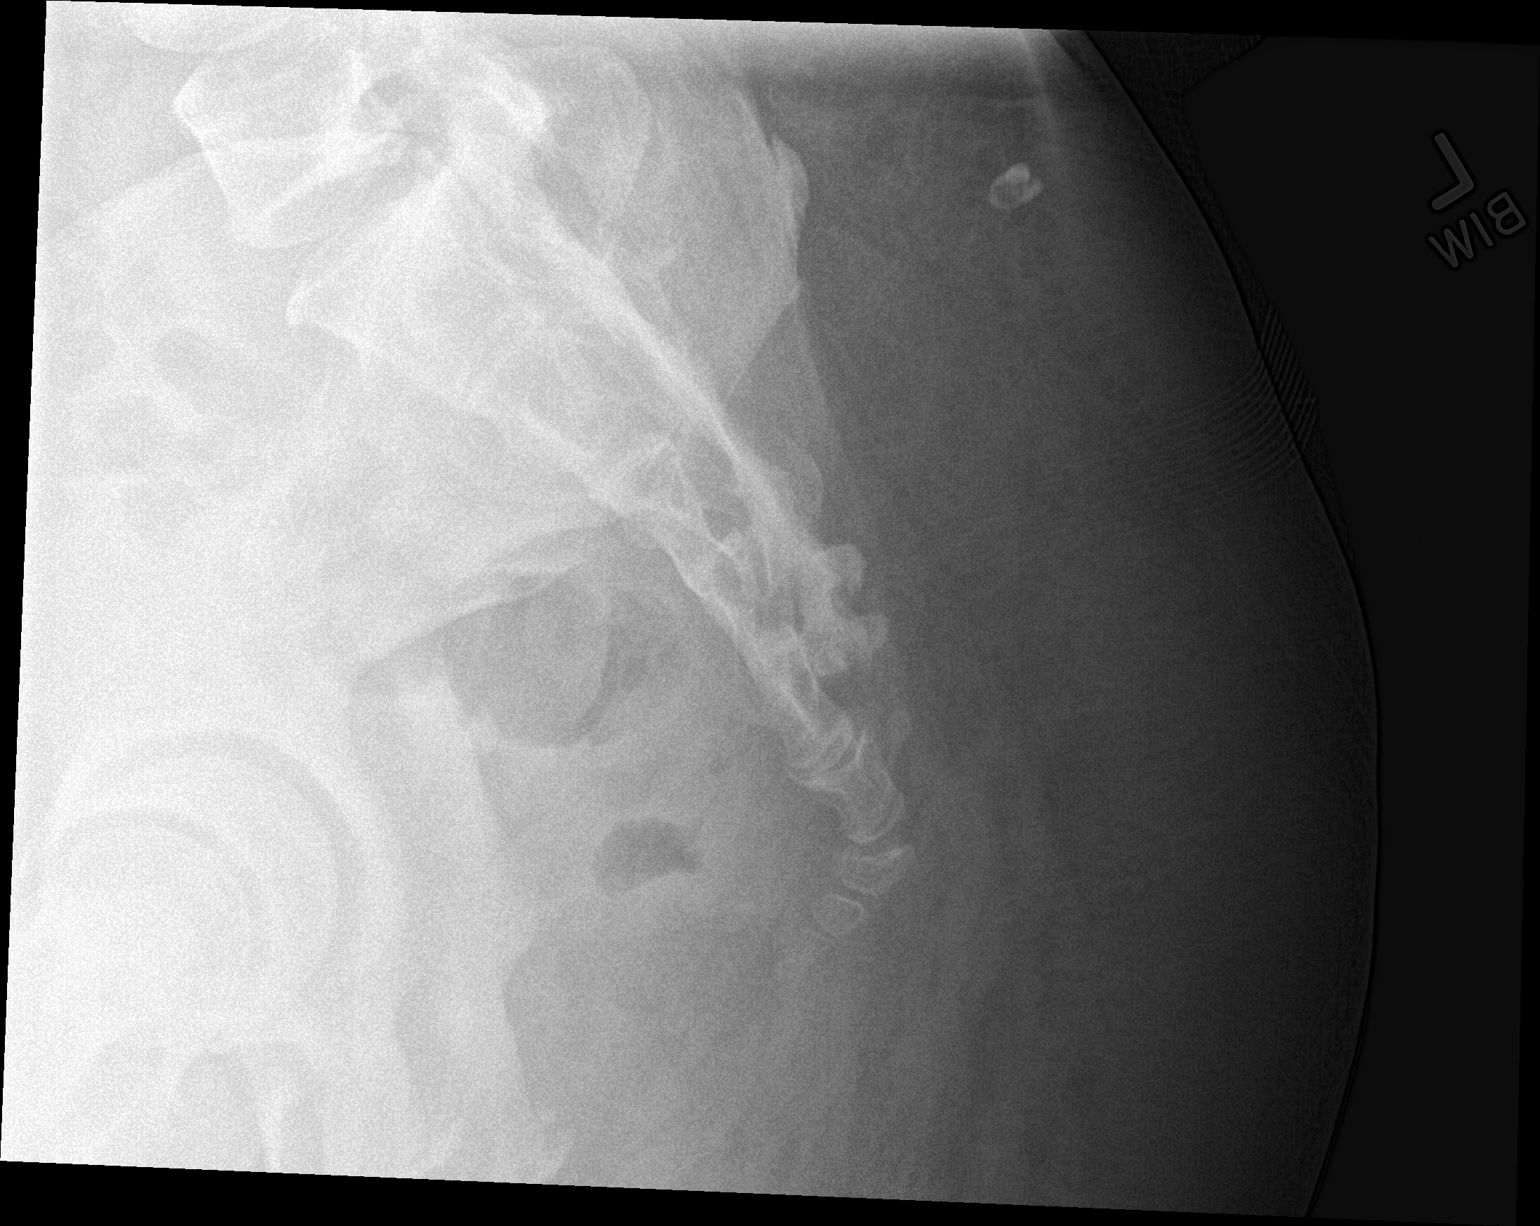

[3 of 3 positions shown; findings below may reference images not displayed]

FINDINGS: Normal sacrococcygeal alignment. No definite displaced fracture or
malalignment. Symmetric normal SI joints. Bony pelvis appears
intact. Pelvic calcifications consistent with venous phleboliths..
IMPRESSION: No acute finding by plain radiography

## 2016-12-07 ENCOUNTER — Encounter: Payer: Self-pay | Admitting: Family Medicine

## 2016-12-07 ENCOUNTER — Ambulatory Visit (INDEPENDENT_AMBULATORY_CARE_PROVIDER_SITE_OTHER): Payer: BLUE CROSS/BLUE SHIELD | Admitting: Family Medicine

## 2016-12-07 ENCOUNTER — Ambulatory Visit: Payer: BLUE CROSS/BLUE SHIELD | Admitting: Family Medicine

## 2016-12-07 VITALS — BP 118/74 | HR 87 | Resp 12 | Ht 65.0 in | Wt 212.1 lb

## 2016-12-07 DIAGNOSIS — I8393 Asymptomatic varicose veins of bilateral lower extremities: Secondary | ICD-10-CM

## 2016-12-07 DIAGNOSIS — R600 Localized edema: Secondary | ICD-10-CM | POA: Diagnosis not present

## 2016-12-07 NOTE — Patient Instructions (Signed)
A few things to remember from today's visit:   Bilateral lower extremity edema - Plan: Basic metabolic panel, Urinalysis, Routine w reflex microscopic  Varicose veins of both lower extremities  Be careful with ibuprofen, this medication can cause swelling in her legs and can cause kidney problems. Decrease salt intake.  Vein disease is a condition that can affect the veins in the legs. It can cause leg pain, varicose veins, swollen legs, or open sores. Varicose veins are swollen and twisted veins. Things that may help: leg exercises (ankle flexion, walking),compression stocking, OTC horse chestnut seed extract 300 mg twice daily.  Compression stockings- [facility] in [place] 25 mmHg   Please be sure medication list is accurate. If a new problem present, please set up appointment sooner than planned today.

## 2016-12-07 NOTE — Progress Notes (Signed)
ACUTE VISIT   HPI:  Chief Complaint  Patient presents with  . Joint Swelling    [name] is a 51 y.o. female, who is here today complaining of bilateral ankle edema, which she noted about a month ago. He has been intermittently, having some lower extremity discomfort (pretibial and mild). She is not sure about exacerbating or alleviating factors. She can notice swelling in the morning when she first gets up. She denies any lower extremity erythema,calves pain, or limitation of ROM. Denies recent long travel or being on hormonal therapy.  She has not tried OTC medications to treat problem.  Other  This is a new problem. The current episode started 1 to 4 weeks ago. The problem occurs intermittently. The problem has been waxing and waning. Pertinent negatives include no abdominal pain, arthralgias, chest pain, coughing, diaphoresis, fatigue, fever, headaches, myalgias, nausea, numbness, rash, sore throat, swollen glands, urinary symptoms, vomiting or weakness. She has tried nothing for the symptoms.    She takes Ibuprofen as needed for back pain, 800 mg at bedtime and a few times per day for back pain. She also has a high sodium diet, eats "a lot of junk food" at work.  Because of her job she walks for about 12 hours daily.  Denies dysuria,gross hematuria,foam in urine, or decreased urine output.   Review of Systems  Constitutional: Negative for activity change, appetite change, diaphoresis, fatigue, fever and unexpected weight change.  HENT: Negative for facial swelling, mouth sores, nosebleeds, sore throat and trouble swallowing.   Respiratory: Negative for cough, shortness of breath and wheezing.   Cardiovascular: Positive for leg swelling. Negative for chest pain and palpitations.  Gastrointestinal: Negative for abdominal pain, nausea and vomiting.       Negative for changes in bowel habits.  Endocrine: Negative for cold intolerance, heat  intolerance, polydipsia, polyphagia and polyuria.  Genitourinary: Negative for decreased urine volume, dysuria and hematuria.  Musculoskeletal: Positive for back pain (chronic). Negative for arthralgias, gait problem and myalgias.  Skin: Negative for rash and wound.  Neurological: Negative for syncope, weakness, numbness and headaches.  Hematological: Negative for adenopathy. Does not bruise/bleed easily.  Psychiatric/Behavioral: Negative for confusion. The patient is nervous/anxious.       Current Outpatient Prescriptions on File Prior to Visit  Medication Sig Dispense Refill  . baclofen (LIORESAL) 10 MG tablet Take 1 tablet (10 mg total) by mouth at bedtime as needed for muscle spasms. (Patient not taking: Reported on 12/07/2016) 30 each 1  . DULoxetine (CYMBALTA) 30 MG capsule Take 1 capsule (30 mg total) by mouth daily. (Patient not taking: Reported on 12/07/2016) 30 capsule 1   No current facility-administered medications on file prior to visit.      Past Medical History:  Diagnosis Date  . Anxiety   . Depression   . Hypertension    No Known Allergies  Social History   Social History  . Marital status: Married    Spouse name: N/A  . Number of children: N/A  . Years of education: N/A   Social History Main Topics  . Smoking status: Current Every Day Smoker    Packs/day: 0.25    Years: 10.00    Types: Cigarettes  . Smokeless tobacco: Never Used  . Alcohol use Yes     Comment: occassional  . Drug use: Yes    Frequency: 7.0 times per week    Types: Marijuana  . Sexual activity: Yes    Birth  control/ protection: None, Surgical     Comment: tubes tied   Other Topics Concern  . None   Social History Narrative  . None    Vitals:   12/07/16 1456  BP: 118/74  Pulse: 87  Resp: 12  SpO2: 98%   Body mass index is 35.3 kg/m.   Physical Exam  Nursing note and vitals reviewed. Constitutional: She is oriented to person, place, and time. She appears  well-developed. No distress.  HENT:  Head: Normocephalic and atraumatic.  Mouth/Throat: Oropharynx is clear and moist and mucous membranes are normal.  Eyes: Pupils are equal, round, and reactive to light. Conjunctivae are normal.  Cardiovascular: Normal rate and regular rhythm.   No murmur heard. Pulses:      Dorsalis pedis pulses are 2+ on the right side, and 2+ on the left side.  Varicose veins in lower extremities, bilateral but  more pronounced on right calf and inner thigh. Holman's sign negative, bilateral.  Respiratory: Effort normal and breath sounds normal. No respiratory distress.  GI: Soft. She exhibits no mass. There is no hepatomegaly. There is no tenderness.  Musculoskeletal: She exhibits edema (peri ankle, mainly medial, pitting edema R>L. Trace pitting LE edema, bilateral.). She exhibits no tenderness.  Lymphadenopathy:    She has no cervical adenopathy.  Neurological: She is alert and oriented to person, place, and time. She has normal strength. Coordination and gait normal.  Skin: Skin is warm. No rash noted. No erythema.  Psychiatric: Her mood appears anxious.  Well groomed, good eye contact.    ASSESSMENT AND PLAN:   Elizabeth Whitney was seen today for joint swelling.  Diagnoses and all orders for this visit:  Bilateral lower extremity edema  We discussed possible etiologies, I am thinking it is related to vein disease. Problem can be aggravated by OTC NSAIDs, weight gain, and high Na++ intake.Some side effects of ibuprofen were discussed.  We discussed a few treatment options, at this time I don't think she needs diuretics, we discussed some side effects. Further recommendations would be given according to lab results.  -     Urinalysis, Routine w reflex microscopic -     Basic metabolic panel  Varicose veins of both lower extremities  I recommend compression stockings: 25 mmHg.  Leg/calf exercises, low salt diet, wt loss, and lower extremity elevation may  help.       Adequate skin care.       Instructed about warning signs.     -[name] was advised to seek immediate medical attention if sudden worsening symptoms or to follow if they persist or if new concerns arise.       Elizabeth G. Martinique, MD  [facility]. [place] office.

## 2016-12-08 LAB — URINALYSIS, MICROSCOPIC ONLY
Bacteria, UA: NONE SEEN [HPF]
Casts: NONE SEEN [LPF]
Crystals: NONE SEEN [HPF]
Yeast: NONE SEEN [HPF]

## 2016-12-08 LAB — BASIC METABOLIC PANEL
BUN: 6 mg/dL — AB (ref 7–25)
CALCIUM: 8.5 mg/dL — AB (ref 8.6–10.4)
CHLORIDE: 107 mmol/L (ref 98–110)
CO2: 24 mmol/L (ref 20–32)
Creat: 0.7 mg/dL (ref 0.50–1.05)
Glucose, Bld: 86 mg/dL (ref 65–99)
Potassium: 4.2 mmol/L (ref 3.5–5.3)
Sodium: 141 mmol/L (ref 135–146)

## 2016-12-08 LAB — URINALYSIS, ROUTINE W REFLEX MICROSCOPIC
Bilirubin Urine: NEGATIVE
GLUCOSE, UA: NEGATIVE
KETONES UR: NEGATIVE
LEUKOCYTES UA: NEGATIVE
Nitrite: NEGATIVE
Specific Gravity, Urine: 1.027 (ref 1.001–1.035)
pH: 7.5 (ref 5.0–8.0)

## 2017-01-10 ENCOUNTER — Encounter (HOSPITAL_COMMUNITY): Payer: Self-pay | Admitting: Emergency Medicine

## 2017-01-10 DIAGNOSIS — Z5321 Procedure and treatment not carried out due to patient leaving prior to being seen by health care provider: Secondary | ICD-10-CM | POA: Insufficient documentation

## 2017-01-10 DIAGNOSIS — H9202 Otalgia, left ear: Secondary | ICD-10-CM | POA: Insufficient documentation

## 2017-01-10 NOTE — ED Triage Notes (Signed)
Patient reports left ear ache radiating to right eye and headache onset last night , denies injury or drainage , no hearing loss or fever .

## 2017-01-11 ENCOUNTER — Emergency Department (HOSPITAL_COMMUNITY)
Admission: EM | Admit: 2017-01-11 | Discharge: 2017-01-11 | Disposition: A | Discharge status: Home / Self Care | Payer: BLUE CROSS/BLUE SHIELD | Attending: Emergency Medicine | Admitting: Emergency Medicine

## 2017-01-11 NOTE — ED Notes (Signed)
Pt stated they were leaving, "my headache is gone"

## 2017-09-26 ENCOUNTER — Ambulatory Visit: Payer: Self-pay | Admitting: *Deleted

## 2017-09-26 NOTE — Telephone Encounter (Signed)
Patient is calling to report she has new swelling in her legs and feet. She states the swelling in not going away like it has in the past. Appointment made for evaluation of new bilateral leg edema.  Reason for Disposition . [1] MODERATE leg swelling (e.g., swelling extends up to knees) AND [2] new onset or worsening  Answer Assessment - Initial Assessment Questions 1. ONSET: "When did the swelling start?" (e.g., minutes, hours, days)     Started about 2 weeks ago 2. LOCATION: "What part of the leg is swollen?"  "Are both legs swollen or just one leg?"     Ankle to leg- both legs 3. SEVERITY: "How bad is the swelling?" (e.g., localized; mild, moderate, severe)  - Localized - small area of swelling localized to one leg  - MILD pedal edema - swelling limited to foot and ankle, pitting edema < 1/4 inch (6 mm) deep, rest and elevation eliminate most or all swelling  - MODERATE edema - swelling of lower leg to knee, pitting edema > 1/4 inch (6 mm) deep, rest and elevation only partially reduce swelling  - SEVERE edema - swelling extends above knee, facial or hand swelling present      moderate 4. REDNESS: "Does the swelling look red or infected?"     no 5. PAIN: "Is the swelling painful to touch?" If so, ask: "How painful is it?"   (Scale 1-10; mild, moderate or severe)     Not painful- they are tight 6. FEVER: "Do you have a fever?" If so, ask: "What is it, how was it measured, and when did it start?"      no 7. CAUSE: "What do you think is causing the leg swelling?"     unknown 8. MEDICAL HISTORY: "Do you have a history of heart failure, kidney disease, liver failure, or cancer?"     No- strong family history of hypertension 9. RECURRENT SYMPTOM: "Have you had leg swelling before?" If so, ask: "When was the last time?" "What happened that time?"     Patient has had ankle swelling before- always went away 10. OTHER SYMPTOMS: "Do you have any other symptoms?" (e.g., chest pain, difficulty  breathing)       no 11. PREGNANCY: "Is there any chance you are pregnant?" "When was your last menstrual period?"       BTL- patient spots intermittent  Protocols used: LEG SWELLING AND EDEMA-A-AH

## 2017-09-27 ENCOUNTER — Encounter: Payer: Self-pay | Admitting: *Deleted

## 2017-09-27 ENCOUNTER — Ambulatory Visit: Payer: BLUE CROSS/BLUE SHIELD | Admitting: Family Medicine

## 2017-09-27 ENCOUNTER — Encounter: Payer: Self-pay | Admitting: Family Medicine

## 2017-09-27 VITALS — BP 122/70 | HR 66 | Temp 98.4°F | Resp 12 | Ht 65.0 in | Wt 210.4 lb

## 2017-09-27 DIAGNOSIS — R600 Localized edema: Secondary | ICD-10-CM

## 2017-09-27 DIAGNOSIS — I83893 Varicose veins of bilateral lower extremities with other complications: Secondary | ICD-10-CM | POA: Diagnosis not present

## 2017-09-27 MED ORDER — FUROSEMIDE 20 MG PO TABS: 20.0000 mg | ORAL_TABLET | Freq: Every day | ORAL | 1 refills | Status: DC | PRN

## 2017-09-27 NOTE — Patient Instructions (Addendum)
A few things to remember from today's visit:   Bilateral lower extremity edema  Varicose veins of bilateral lower extremities with other complications   Varicose Veins Varicose veins are veins that have become enlarged and twisted. They are usually seen in the legs but can occur in other parts of the body as well. What are the causes? This condition is the result of valves in the veins not working properly. Valves in the veins help to return blood from the leg to the heart. If these valves are damaged, blood flows backward and backs up into the veins in the leg near the skin. This causes the veins to become larger. What increases the risk? People who are on their feet a lot, who are pregnant, or who are overweight are more likely to develop varicose veins. What are the signs or symptoms?  Bulging, twisted-appearing, bluish veins, most commonly found on the legs.  Leg pain or a feeling of heaviness. These symptoms may be worse at the end of the day.  Leg swelling.  Changes in skin color. How is this diagnosed? A health care provider can usually diagnose varicose veins by examining your legs. Your health care provider may also recommend an ultrasound of your leg veins. How is this treated? Most varicose veins can be treated at home.However, other treatments are available for people who have persistent symptoms or want to improve the cosmetic appearance of the varicose veins. These treatment options include:  Sclerotherapy. A solution is injected into the vein to close it off.  Laser treatment. A laser is used to heat the vein to close it off.  Radiofrequency vein ablation. An electrical current produced by radio waves is used to close off the vein.  Phlebectomy. The vein is surgically removed through small incisions made over the varicose vein.  Vein ligation and stripping. The vein is surgically removed through incisions made over the varicose vein after the vein has been tied  (ligated).  Follow these instructions at home:  Do not stand or sit in one position for long periods of time. Do not sit with your legs crossed. Rest with your legs raised during the day.  Wear compression stockings as directed by your health care provider. These stockings help to prevent blood clots and reduce swelling in your legs.  Do not wear other tight, encircling garments around your legs, pelvis, or waist.  Walk as much as possible to increase blood flow.  Raise the foot of your bed at night with 2-inch blocks.  If you get a cut in the skin over the vein and the vein bleeds, lie down with your leg raised and press on it with a clean cloth until the bleeding stops. Then place a bandage (dressing) on the cut. See your health care provider if it continues to bleed. Contact a health care provider if:  The skin around your ankle starts to break down.  You have pain, redness, tenderness, or hard swelling in your leg over a vein.  You are uncomfortable because of leg pain. This information is not intended to replace advice given to you by your health care provider. Make sure you discuss any questions you have with your health care provider. Document Released: 01/10/2005 Document Revised: 09/08/2015 Document Reviewed: 10/04/2015 Elsevier Interactive Patient Education  2017 [employer]  Vein disease is a condition that can affect the veins in the legs. It can cause leg pain, varicose veins, swollen legs, or open sores. Varicose veins are swollen and  twisted veins. Things that may help: leg exercises (ankle flexion, walking),compression stocking, OTC horse chestnut seed extract 300 mg twice daily, for itchy skin cortisone and moisturizers.  Compression stockings- El[facility] Therapy in [place] 25 mmHg  Please be sure medication list is accurate. If a new problem present, please set up appointment sooner than planned today.

## 2017-09-27 NOTE — Assessment & Plan Note (Signed)
Educated about problem and prognosis as well as treatment options. Strongly recommend compression stockings, 25 mmHg. Lower extremity elevation few times during the day. OTC horse chestnut may also help. Good skin care. Furosemide 20 mg daily as needed also recommended, we discussed some side effects.

## 2017-09-27 NOTE — Progress Notes (Signed)
ACUTE VISIT   HPI:  Chief Complaint  Patient presents with  . Edema    bilateral, legs and ankles, off and on for years, getting worse over the last 2 weeks    Elizabeth Whitney is a 52 y.o. female, who is here today complaining of lower extremity edema which has been going on for at least a year but seems to be worse for the past 2-3 weeks. Problem is worse at the end of the day.  Alleviated by elevation, in the morning she has no edema.  She denies fever, chills, chest pain, dyspnea, orthopnea, PND, foam in urine, gross hematuria, or decreased urine output. She has not tried OTC medications. She has not noted local leg erythema,skin lesions, and has no pain.  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    Review of Systems  Constitutional: Negative for activity change, appetite change, fatigue and fever.  Respiratory: Negative for cough, shortness of breath and wheezing.   Cardiovascular: Positive for leg swelling. Negative for chest pain and palpitations.  Gastrointestinal: Negative for abdominal pain, nausea and vomiting.       Negative for changes in bowel habits.  Genitourinary: Negative for decreased urine volume, dysuria and hematuria.  Musculoskeletal: Negative for gait problem and myalgias.  Skin: Negative for rash and wound.  Neurological: Negative for syncope, weakness and headaches.      Current Outpatient Medications on File Prior to Visit  Medication Sig Dispense Refill  . baclofen (LIORESAL) 10 MG tablet Take 1 tablet (10 mg total) by mouth at bedtime as needed for muscle spasms. (Patient not taking: Reported on 09/27/2017) 30 each 1  . DULoxetine (CYMBALTA) 30 MG capsule Take 1 capsule (30 mg total) by mouth daily. (Patient not taking: Reported on 09/27/2017)  30 capsule 1   No current facility-administered medications on file prior to visit.      Past Medical History:  Diagnosis Date  . Anxiety   . Depression   . Hypertension    No Known Allergies  Social History   Socioeconomic History  . Marital status: Married    Spouse name: Not on file  . Number of children: Not on file  . Years of education: Not on file  . Highest education level: Not on file  Occupational History  . Not on file  Social Needs  . Financial resource strain: Not on file  . Food insecurity:    Worry: Not on file    Inability: Not on file  . Transportation needs:    Medical: Not on file    Non-medical: Not on file  Tobacco Use  . Smoking status: Current Every Day Smoker    Packs/day: 0.25    Years: 10.00    Pack years: 2.50    Types: Cigarettes  . Smokeless tobacco: Never Used  Substance and Sexual Activity  . Alcohol use: Yes    Comment: occassional  . Drug use: Yes    Frequency: 7.0 times per week    Types: Marijuana  . Sexual activity: Yes    Birth control/protection: None, Surgical    Comment: tubes tied  Lifestyle  . Physical activity:    Days per week: Not on file    Minutes per session:  Not on file  . Stress: Not on file  Relationships  . Social connections:    Talks on phone: Not on file    Gets together: Not on file    Attends religious service: Not on file    Active member of club or organization: Not on file    Attends meetings of clubs or organizations: Not on file    Relationship status: Not on file  Other Topics Concern  . Not on file  Social History Narrative  . Not on file    Vitals:   09/27/17 1502  BP: 122/70  Pulse: 66  Resp: 12  Temp: 98.4 F (36.9 C)  SpO2: 98%   Body mass index is 35.01 kg/m.    Physical Exam  Nursing note and vitals reviewed. Constitutional: She is oriented to person, place, and time. She appears well-developed. No distress.  HENT:  Head: Atraumatic.  Mouth/Throat: Oropharynx is  clear and moist and mucous membranes are normal.  Eyes: Conjunctivae are normal.  Neck: No JVD present.  Cardiovascular: Normal rate and regular rhythm.  No murmur heard. Pulses:      Dorsalis pedis pulses are 2+ on the right side, and 2+ on the left side.  Varicose veins in lower extremities (thigh and calf) bilateral.  Respiratory: Effort normal and breath sounds normal. No respiratory distress.  GI: Soft. She exhibits no mass. There is no hepatomegaly. There is no tenderness.  Musculoskeletal: She exhibits edema (Trace pittign LE edema,bilateral). She exhibits no tenderness.  Neurological: She is alert and oriented to person, place, and time. She has normal strength. Coordination normal.  Skin: Skin is warm. No rash noted. No erythema.  Psychiatric: She has a normal mood and affect.  Well groomed, good eye contact.    ASSESSMENT AND PLAN:   Ms. Elizabeth Whitney was seen today for edema.  Diagnoses and all orders for this visit:  Bilateral lower extremity edema  Chronic. We discussed possible etiologies. I do not obtain further work-up is needed today.  -     furosemide (LASIX) 20 MG tablet; Take 1 tablet (20 mg total) by mouth daily as needed.  Varicose veins of both lower extremities Educated about problem and prognosis as well as treatment options. Strongly recommend compression stockings, 25 mmHg. Lower extremity elevation few times during the day. OTC horse chestnut may also help. Good skin care. Furosemide 20 mg daily as needed also recommended, we discussed some side effects.    Return if symptoms worsen or fail to improve.    Willistine Ferrall G. Martinique, MD  [facility]. [place] office.

## 2018-01-01 ENCOUNTER — Emergency Department (HOSPITAL_COMMUNITY)
Admission: EM | Admit: 2018-01-01 | Discharge: 2018-01-01 | Disposition: A | Discharge status: Home / Self Care | Payer: BLUE CROSS/BLUE SHIELD | Attending: Emergency Medicine | Admitting: Emergency Medicine

## 2018-01-01 ENCOUNTER — Other Ambulatory Visit: Payer: Self-pay

## 2018-01-01 DIAGNOSIS — H5712 Ocular pain, left eye: Secondary | ICD-10-CM | POA: Diagnosis present

## 2018-01-01 DIAGNOSIS — F17210 Nicotine dependence, cigarettes, uncomplicated: Secondary | ICD-10-CM | POA: Insufficient documentation

## 2018-01-01 DIAGNOSIS — F1290 Cannabis use, unspecified, uncomplicated: Secondary | ICD-10-CM | POA: Insufficient documentation

## 2018-01-01 DIAGNOSIS — H10022 Other mucopurulent conjunctivitis, left eye: Secondary | ICD-10-CM | POA: Insufficient documentation

## 2018-01-01 DIAGNOSIS — H109 Unspecified conjunctivitis: Secondary | ICD-10-CM

## 2018-01-01 MED ORDER — ERYTHROMYCIN 5 MG/GM OP OINT: TOPICAL_OINTMENT | OPHTHALMIC | 0 refills | Status: AC

## 2018-01-01 NOTE — ED Triage Notes (Signed)
Patient reports left eye pain that started last night. Patient reports itching, drainage. Denies vision change. Denies fevers.

## 2018-01-01 NOTE — ED Provider Notes (Signed)
[facility] DEPT Provider Note   CSN: [id number] Arrival date & time: 01/01/18  1101     History   Chief Complaint Chief Complaint  Patient presents with  . Eye Pain    HPI Elizabeth Whitney is a 52 y.o. female who presents to ED for evaluation of 4-hour history of left eye irritation, purulent drainage, tearing that began she woke up.  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  She was rubbing her eye yesterday.  She then woke up this morning with the symptoms.  She has not tried any over-the-counter medications to help with symptoms.  Denies any pain with eye movements, swelling around the eye, fever, vision changes, contact lens or corrective lens use.  She does not see an eye doctor.  HPI  Past Medical History:  Diagnosis Date  . Anxiety   . Depression   . Hypertension     Patient Active Problem List   Diagnosis Date Noted  . Varicose veins of both lower extremities 12/07/2016  . Anxiety and depression 05/15/2016  . Chronic back pain 05/15/2016  . Skin sensation disturbance 05/15/2016  . ANEMIA, IRON DEFICIENCY 07/14/2008  . 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 PICA 07/06/2008  . PANIC DISORDER 04/24/2007  . SUBSTANCE ABUSE, MULTIPLE 04/24/2007  . DEPRESSION 04/24/2007  . PREGNANCY-INDUCED HYPERTENSION 04/24/2007  . SYNCOPE 04/24/2007    Past Surgical History:  Procedure Laterality Date  . CESAREAN SECTION    . FRACTURE SURGERY  2003   rod in ankle     OB History    Gravida  3   Para  3   Term  3   Preterm      AB      Living        SAB      TAB      Ectopic      Multiple      Live Births               Home Medications    Prior to Admission medications   Medication Sig Start Date End Date Taking? Authorizing Provider  baclofen (LIORESAL) 10 MG tablet Take 1 tablet (10 mg total) by mouth at bedtime as needed for muscle spasms. Patient not taking: Reported on 09/27/2017 05/15/16    Martinique, [name] G, MD  DULoxetine (CYMBALTA) 30 MG capsule Take 1 capsule (30 mg total) by mouth daily. Patient not taking: Reported on 09/27/2017 05/15/16   Martin[place], [name] G, MD  erythromycin ophthalmic ointment Place a 1/2 inch ribbon of ointment into the lower eyelid. 01/01/18   Jaysun Wessels, PA-C  furosemide (LASIX) 20 MG tablet Take 1 tablet (20 mg total) by mouth daily as needed. 09/27/17   Martinique, [name] G, MD    Family History Family History  Problem Relation Age of Onset  . Cancer Father   . Hypertension Sister   . Hypertension Brother   . Diabetes Maternal Uncle     Social History Social History   Tobacco Use  . Smoking status: Current Every Day Smoker    Packs/day: 0.25    Years: 10.00    Pack years: 2.50    Types: Cigarettes  . Smokeless tobacco: Never Used  Substance Use Topics  . Alcohol use: Yes    Comment: occassional  . Drug use: Yes    Frequency: 7.0 times per week    Types: Marijuana     Allergies   Patient has no known allergies.  Review of Systems Review of Systems  Constitutional: Negative for chills and fever.  HENT: Negative for facial swelling.   Eyes: Positive for discharge, redness and itching. Negative for photophobia, pain and visual disturbance.     Physical Exam Updated Vital Signs BP 121/81 (BP Location: Left Arm)   Pulse 68   Temp 98.4 F (36.9 C) (Oral)   Resp 16   Ht 5\' 5"  (1.651 m)   Wt 97.5 kg   LMP 10/01/2017   SpO2 98%   BMI 35.78 kg/m   Physical Exam  Constitutional: She appears well-developed and well-nourished. No distress.  HENT:  Head: Normocephalic and atraumatic.  Eyes: Pupils are equal, round, and reactive to light. EOM are normal. Left eye exhibits discharge. Left conjunctiva is injected. No scleral icterus. Right eye exhibits normal extraocular motion. Left eye exhibits normal extraocular motion.  Left eye with injected conjunctiva, no eyelid swelling or erythema or tenderness to palpation.  Mild clear  tearful/purulent drainage noted.  No foreign bodies noted.  No pain with EOMs.  No chemosis, proptosis, or consensual photophobia.  Neck: Normal range of motion.  Pulmonary/Chest: Effort normal. No respiratory distress.  Neurological: She is alert.  Skin: No rash noted. She is not diaphoretic.  Psychiatric: She has a normal mood and affect.  Nursing note and vitals reviewed.    ED Treatments / Results  Labs (all labs ordered are listed, but only abnormal results are displayed) Labs Reviewed - No data to display  EKG None  Radiology No results found.  Procedures Procedures (including critical care time)  Medications Ordered in ED Medications - No data to display   Initial Impression / Assessment and Plan / ED Course  I have reviewed the triage vital signs and the nursing notes.  Pertinent labs & imaging results that were available during my care of the patient were reviewed by me and considered in my medical decision making (see chart for details).     52 year old female presents to ED for evaluation of left eye redness, irritation, purulent and tearful drainage since waking up this morning.  Denies any changes in vision, swelling around the eye, pain with eye movements, foreign body sensation or trauma to the area.  On exam, pupils are equal, round and reactive.  There is evidence of conjunctivitis with mild clear/purulent drainage noted on the left eye, conjunctival injection.  EOMs are intact.  Doubt orbital cellulitis, iritis, keratitis or corneal abrasion as a cause of her symptoms.  Will give erythromycin ointment and ophthalmology follow-up.  Advised to return to ED for any severe worsening symptoms.  Portions of this note were generated with [employer]. Dictation errors may occur despite best attempts at proofreading.   Final Clinical Impressions(s) / ED Diagnoses   Final diagnoses:  Conjunctivitis of left eye, unspecified conjunctivitis type    ED  Discharge Orders         Ordered    erythromycin ophthalmic ointment     01/01/18 1236           Delia Heady, PA-C 01/01/18 [place], Wenda Overland, MD 01/01/18 [id number]

## 2018-01-01 NOTE — ED Notes (Signed)
Patient verbalized understanding of discharge instructions, no questions. Patient ambulated out of ED with steady gait in no distress.  

## 2018-01-01 NOTE — Discharge Instructions (Signed)
Return to ED for worsening symptoms, pain with eye movements, trouble moving your eye, swelling around her eye, foreign body sensation.

## 2018-03-09 ENCOUNTER — Other Ambulatory Visit: Payer: Self-pay

## 2018-03-09 ENCOUNTER — Encounter (HOSPITAL_COMMUNITY): Payer: Self-pay

## 2018-03-09 ENCOUNTER — Emergency Department (HOSPITAL_COMMUNITY)
Admission: EM | Admit: 2018-03-09 | Discharge: 2018-03-09 | Disposition: A | Discharge status: Home / Self Care | Payer: BLUE CROSS/BLUE SHIELD | Attending: Emergency Medicine | Admitting: Emergency Medicine

## 2018-03-09 ENCOUNTER — Emergency Department (HOSPITAL_COMMUNITY): Payer: BLUE CROSS/BLUE SHIELD

## 2018-03-09 DIAGNOSIS — M25571 Pain in right ankle and joints of right foot: Secondary | ICD-10-CM | POA: Diagnosis present

## 2018-03-09 DIAGNOSIS — H1032 Unspecified acute conjunctivitis, left eye: Secondary | ICD-10-CM

## 2018-03-09 DIAGNOSIS — F17210 Nicotine dependence, cigarettes, uncomplicated: Secondary | ICD-10-CM | POA: Diagnosis not present

## 2018-03-09 DIAGNOSIS — I10 Essential (primary) hypertension: Secondary | ICD-10-CM | POA: Insufficient documentation

## 2018-03-09 MED ORDER — OFLOXACIN 0.3 % OP SOLN: 1.0000 [drp] | OPHTHALMIC | 0 refills | Status: AC

## 2018-03-09 MED ORDER — OXYCODONE-ACETAMINOPHEN 5-325 MG PO TABS
1.0000 | ORAL_TABLET | Freq: Once | ORAL | Status: AC
  Administered 2018-03-09: 1 via ORAL
  Filled 2018-03-09: qty 1

## 2018-03-09 MED ORDER — KETOROLAC TROMETHAMINE 60 MG/2ML IM SOLN
60.0000 mg | Freq: Once | INTRAMUSCULAR | Status: AC
  Administered 2018-03-09: 60 mg via INTRAMUSCULAR
  Filled 2018-03-09: qty 2

## 2018-03-09 NOTE — ED Notes (Signed)
Patient transported to X-ray 

## 2018-03-09 NOTE — ED Provider Notes (Signed)
[facility] DEPT Provider Note   CSN: [id number] Arrival date & time: 03/09/18  0160     History   Chief Complaint Chief Complaint  Patient presents with  . Right Ankle Pain    HPI Elizabeth Whitney is a 52 y.o. female.  HPI 52 year old female presents the emergency department with 2 complaints  First complaint is new right ankle pain since she awoke this morning.  States it felt normal when she went to bed.  No injury or trauma.  No history of gout.  She reports it hurts on the outside of her right ankle.  Knows erythema or warmth.  Reports mild swelling of the lateral malleolus.  No numbness or tingling down in the right foot.  No swelling of her right leg otherwise.  Second complaint new redness of her left eye without significant pain and patient is concerned about the possibility of "pinkeye".  No recent sick contacts.  No recent viral upper respiratory tract infections.  No change in her vision out of her left eye.  Some watering and crusting of her left eye.  No recent injury or trauma to her left eye.  She does not wear contact lenses.   Past Medical History:  Diagnosis Date  . Anxiety   . Depression   . Hypertension     Patient Active Problem List   Diagnosis Date Noted  . Varicose veins of both lower extremities 12/07/2016  . Anxiety and depression 05/15/2016  . Chronic back pain 05/15/2016  . Skin sensation disturbance 05/15/2016  . ANEMIA, IRON DEFICIENCY 07/14/2008  . 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 PICA 07/06/2008  . PANIC DISORDER 04/24/2007  . SUBSTANCE ABUSE, MULTIPLE 04/24/2007  . DEPRESSION 04/24/2007  . PREGNANCY-INDUCED HYPERTENSION 04/24/2007  . SYNCOPE 04/24/2007    Past Surgical History:  Procedure Laterality Date  . CESAREAN SECTION    . FRACTURE SURGERY  2003   rod in ankle     OB History    Gravida  3   Para  3   Term  3   Preterm      AB      Living        SAB      TAB      Ectopic      Multiple      Live Births               Home Medications    Prior to Admission medications   Medication Sig Start Date End Date Taking? Authorizing Provider  erythromycin ophthalmic ointment Place a 1/2 inch ribbon of ointment into the lower eyelid. Patient taking differently: Place 1 application into the left eye daily.  01/01/18  Yes Khatri, Hina, PA-C  ibuprofen (ADVIL,MOTRIN) 200 MG tablet Take 800 mg by mouth every 6 (six) hours as needed for headache or moderate pain.   Yes [provider]  Tetrahydroz-Dextran-PEG-Povid (EYE DROPS ADVANCED RELIEF OP) Place 2 drops into both eyes 4 (four) times daily as needed (for redness relief).   Yes [provider]  ofloxacin (OCUFLOX) 0.3 % ophthalmic solution Place 1 drop into the left eye every 4 (four) hours. 03/09/18   Jola Schmidt, MD    Family History Family History  Problem Relation Age of Onset  . Cancer Father   . Hypertension Sister   . Hypertension Brother   . Diabetes Maternal Uncle     Social History Social History   Tobacco Use  . Smoking status: Current  Every Day Smoker    Packs/day: 0.25    Years: 10.00    Pack years: 2.50    Types: Cigarettes  . Smokeless tobacco: Never Used  Substance Use Topics  . Alcohol use: Yes    Comment: occassional  . Drug use: Yes    Frequency: 7.0 times per week    Types: Marijuana     Allergies   Patient has no known allergies.   Review of Systems Review of Systems  All other systems reviewed and are negative.    Physical Exam Updated Vital Signs BP (!) 143/86 (BP Location: Left Arm)   Pulse 77   Temp 98.7 F (37.1 C) (Oral)   Resp 17   Ht 5\' 5"  (1.651 m)   Wt 98 kg   SpO2 98%   BMI 35.94 kg/m   Physical Exam  Constitutional: She is oriented to person, place, and time. She appears well-developed and well-nourished. No distress.  HENT:  Head: Normocephalic and atraumatic.  Conjunctival erythema of her left eye.   Extraocular movements are normal.  No periorbital cellulitis of the left eye  Eyes: EOM are normal.  Neck: Normal range of motion.  Cardiovascular: Normal rate and regular rhythm.  Pulmonary/Chest: Effort normal and breath sounds normal.  Abdominal: Soft. She exhibits no distension. There is no tenderness.  Musculoskeletal: Normal range of motion.  Mild tenderness of right lateral malleolus without significant deformity.  Normal PT and DP pulse of the right foot.  No swelling of the right lower extremity or erythema or warmth of the right lower extremity and specifically the right ankle.  Neurological: She is alert and oriented to person, place, and time.  Skin: Skin is warm and dry.  Psychiatric: She has a normal mood and affect. Judgment normal.  Nursing note and vitals reviewed.    ED Treatments / Results  Labs (all labs ordered are listed, but only abnormal results are displayed) Labs Reviewed - No data to display  EKG None  Radiology Dg Ankle Complete Right  Result Date: 03/09/2018 CLINICAL DATA:  Pain. EXAM: RIGHT ANKLE - COMPLETE 3+ VIEW COMPARISON:  None. FINDINGS: Calcification in the distal Achilles tendon. Enthesopathic changes at the insertion site of the Achilles tendon on the posterior calcaneus. Small plantar spur. No acute fractures. The ankle mortise is intact. IMPRESSION: 1. No acute fracture. 2. Calcification in the distal Achilles tendon may be from previous trauma or calcific tendinosis. 3. No other abnormalities. Electronically Signed   By: Dorise Bullion III M.D   On: 03/09/2018 10:46    Procedures Procedures (including critical care time)  Medications Ordered in ED Medications  oxyCODONE-acetaminophen (PERCOCET/ROXICET) 5-325 MG per tablet 1 tablet (1 tablet Oral Given 03/09/18 1025)  ketorolac (TORADOL) injection 60 mg (60 mg Intramuscular Given 03/09/18 1025)     Initial Impression / Assessment and Plan / ED Course  I have reviewed the triage vital  signs and the nursing notes.  Pertinent labs & imaging results that were available during my care of the patient were reviewed by me and considered in my medical decision making (see chart for details).     Nonspecific right ankle pain.  Suspect possibly developing gout.  Doubt septic arthritis.  X-ray without significant abnormalities.  No acute trauma.  Primary care follow-up.  Recommended ice and elevation and anti-inflammatories.  In regards to the left eye she will be placed on antibiotics for conjunctivitis.  Final Clinical Impressions(s) / ED Diagnoses   Final diagnoses:  Acute  right ankle pain  Acute conjunctivitis of left eye, unspecified acute conjunctivitis type    ED Discharge Orders         Ordered    ofloxacin (OCUFLOX) 0.3 % ophthalmic solution  Every 4 hours     03/09/18 [place], Maisha Bogen, MD 03/09/18 1105

## 2018-03-09 NOTE — ED Triage Notes (Signed)
Patient is AOx4 and Ambulatory however is having diffuculty putting weight on right ankle. Patient stated right ankle began to feel sore yesterday however when patient woke up this morning, right ankle is causing great deal of pain.   Patient has pink eye and is taking medication for that but stated that she would like a doctor to take a look at it while she is here.

## 2018-03-11 ENCOUNTER — Encounter: Payer: Self-pay | Admitting: Family Medicine

## 2018-03-11 ENCOUNTER — Ambulatory Visit: Payer: BLUE CROSS/BLUE SHIELD | Admitting: Family Medicine

## 2018-03-11 VITALS — BP 118/72 | HR 77 | Temp 98.5°F | Resp 12 | Ht 65.0 in | Wt 216.4 lb

## 2018-03-11 DIAGNOSIS — M25571 Pain in right ankle and joints of right foot: Secondary | ICD-10-CM

## 2018-03-11 DIAGNOSIS — Z1239 Encounter for other screening for malignant neoplasm of breast: Secondary | ICD-10-CM | POA: Diagnosis not present

## 2018-03-11 DIAGNOSIS — Z0000 Encounter for general adult medical examination without abnormal findings: Secondary | ICD-10-CM

## 2018-03-11 DIAGNOSIS — H1012 Acute atopic conjunctivitis, left eye: Secondary | ICD-10-CM

## 2018-03-11 DIAGNOSIS — L0292 Furuncle, unspecified: Secondary | ICD-10-CM

## 2018-03-11 MED ORDER — OLOPATADINE HCL 0.7 % OP SOLN: 1.0000 [drp] | Freq: Every day | OPHTHALMIC | 2 refills | Status: AC

## 2018-03-11 MED ORDER — DOXYCYCLINE HYCLATE 100 MG PO TABS: 100.0000 mg | ORAL_TABLET | Freq: Two times a day (BID) | ORAL | 0 refills | Status: AC

## 2018-03-11 MED ORDER — CLINDAMYCIN PHOSPHATE 1 % EX GEL: Freq: Every day | CUTANEOUS | 1 refills | Status: AC | PRN

## 2018-03-11 NOTE — Progress Notes (Signed)
HPI:   Elizabeth Whitney is a 52 y.o. female, who is here today to follow on recent ER visit.    She was evaluated in the ED on 03/09/2018 due to right ankle pain. Ankle x-ray showed no acute fracture, calcification in the distal Achilles tendon, which may be from previous trauma or calcific tendinosis. She is still having right ankle pain, posterior aspect of the heel, it is gradually improving. Pain is exacerbated by flexion and walking, alleviated by rest. She has not noted skin erythema or local edema. No history of trauma. She denies numbness or tingling.  Left eye watery and erythematosus + pruritic. Problem started again about 5 days ago. She resume topical antibiotic that was prescribed before but it did not help. She has not noted yellowish drainage. She was treated for conjunctivitis in 12/2017. She associated headache or vision changes. No history of trauma.  She is also having some sore throat, worse in the morning. She denies nasal congestion or rhinorrhea. Mild cough with "a little bit of" wheezing. + Smoker.  -Intermittent small erythematous papular lesion on buttocks,,not sure about tenderness.States that she starts "picking on" lesion or scratching, as a result it increases in size and bleeds.Eventually lesion disappears leaving a pigmented area. Problem happens once in a while but pigmentation changes are constant.  Review of Systems  Constitutional: Negative for activity change, appetite change, fatigue and fever.  HENT: Positive for sore throat. Negative for mouth sores and nosebleeds.   Eyes: Positive for discharge, redness and itching. Negative for visual disturbance.  Respiratory: Positive for cough and wheezing. Negative for shortness of breath.   Cardiovascular: Negative for chest pain, palpitations and leg swelling.  Gastrointestinal: Negative for abdominal pain, nausea and vomiting.       Negative for changes in bowel habits.    Genitourinary: Negative for decreased urine volume and hematuria.  Musculoskeletal: Positive for arthralgias. Negative for joint swelling.  Skin: Positive for rash.  Allergic/Immunologic: Negative for environmental allergies.  Neurological: Negative for syncope, weakness and headaches.  Psychiatric/Behavioral: The patient is nervous/anxious.       Current Outpatient Medications on File Prior to Visit  Medication Sig Dispense Refill  . erythromycin ophthalmic ointment Place a 1/2 inch ribbon of ointment into the lower eyelid. (Patient taking differently: Place 1 application into the left eye daily. ) 1 g 0  . ibuprofen (ADVIL,MOTRIN) 200 MG tablet Take 800 mg by mouth every 6 (six) hours as needed for headache or moderate pain.    Marland Kitchen ofloxacin (OCUFLOX) 0.3 % ophthalmic solution Place 1 drop into the left eye every 4 (four) hours. 5 mL 0  . Tetrahydroz-Dextran-PEG-Povid (EYE DROPS ADVANCED RELIEF OP) Place 2 drops into both eyes 4 (four) times daily as needed (for redness relief).     No current facility-administered medications on file prior to visit.      Past Medical History:  Diagnosis Date  . Anxiety   . Depression   . Hypertension    No Known Allergies  Social History   Socioeconomic History  . Marital status: Married    Spouse name: Not on file  . Number of children: Not on file  . Years of education: Not on file  . Highest education level: Not on file  Occupational History  . Not on file  Social Needs  . Financial resource strain: Not on file  . Food insecurity:    Worry: Not on file    Inability: Not on  file  . Transportation needs:    Medical: Not on file    Non-medical: Not on file  Tobacco Use  . Smoking status: Current Every Day Smoker    Packs/day: 0.25    Years: 10.00    Pack years: 2.50    Types: Cigarettes  . Smokeless tobacco: Never Used  Substance and Sexual Activity  . Alcohol use: Yes    Comment: occassional  . Drug use: Yes    Frequency:  7.0 times per week    Types: Marijuana  . Sexual activity: Yes    Birth control/protection: None, Surgical    Comment: tubes tied  Lifestyle  . Physical activity:    Days per week: Not on file    Minutes per session: Not on file  . Stress: Not on file  Relationships  . Social connections:    Talks on phone: Not on file    Gets together: Not on file    Attends religious service: Not on file    Active member of club or organization: Not on file    Attends meetings of clubs or organizations: Not on file    Relationship status: Not on file  Other Topics Concern  . Not on file  Social History Narrative  . Not on file    Vitals:   03/11/18 0928  BP: 118/72  Pulse: 77  Resp: 12  Temp: 98.5 F (36.9 C)  SpO2: 97%   Body mass index is 36.01 kg/m.  Physical Exam  Nursing note and vitals reviewed. Constitutional: She is oriented to person, place, and time. She appears well-developed. No distress.  HENT:  Head: Normocephalic and atraumatic.  Mouth/Throat: Oropharynx is clear and moist and mucous membranes are normal.  Hypertrophic turbinates. Mild postnasal drainage.  Eyes: Pupils are equal, round, and reactive to light. EOM are normal. Right conjunctiva is not injected. Right conjunctiva has no hemorrhage. Left conjunctiva is injected. Left conjunctiva has no hemorrhage.  Left epiphora.  Cardiovascular: Normal rate and regular rhythm.  No murmur heard. Pulses:      Dorsalis pedis pulses are 2+ on the right side, and 2+ on the left side.  Respiratory: Effort normal and breath sounds normal. No respiratory distress.  GI: Soft. She exhibits no mass. There is no hepatomegaly. There is no tenderness.  Musculoskeletal: She exhibits no edema.       Right foot: There is decreased range of motion (Mild limitation of flexion) and tenderness. There is no bony tenderness, no swelling and no deformity.       Feet:  Lymphadenopathy:    She has no cervical adenopathy.  Neurological:  She is alert and oriented to person, place, and time. She has normal strength. No cranial nerve deficit. Gait normal.  Skin: Skin is warm. No rash noted. No erythema.  Hyperpigmented rounded lesions on left buttocks x 3. No tenderness, erythema, or induration.  Psychiatric: She has a normal mood and affect.  Well groomed, good eye contact.    ASSESSMENT AND PLAN:  Ms. Latonja was seen today for er follow-up.  Orders Placed This Encounter  Procedures  . MM 3D SCREEN BREAST BILATERAL    Right ankle pain, unspecified chronicity Improving. Recommend following with podiatrist if pain is not resolved in 3 to 4 weeks.  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 Strongly recommend avoiding scratching or "picking on" lesion. Explained that postinflammatory pigmentation changes are difficult to treat and most likely will not go  away. Recommend 2 weeks of oral antibiotic to decrease recurrence of erythematosus lesions (4 inguinal). She will continue with topical antibiotic. If problem is persistent, dermatology referral would be considered.  -     doxycycline (VIBRA-TABS) 100 MG tablet; Take 1 tablet (100 mg total) by mouth 2 (two) times daily for 15 days. -     clindamycin (CLINDAGEL) 1 % gel; Apply topically daily as needed.  Conjunctivitis, allergic, left We discussed possible etiologies. It has not improved with topical antibiotic, so recommend discontinuing. Warm compressions may help. It could be allergic, so topical olopatadine recommended. Instructed about warning signs.  -     Olopatadine HCl (PAZEO) 0.7 % SOLN; Apply 1 drop to eye daily.  Breast cancer screening -     MM 3D SCREEN BREAST BILATERAL; Future  Healthcare maintenance She has not had a CPE in a long time. Colon cancer screening, she prefers to wait until next visit. She agrees with having mammogram arranged.     Haitham Dolinsky G. [place], MD  [facility]. [place] office.

## 2018-03-11 NOTE — Patient Instructions (Signed)
A few things to remember from today's visit:   Forunculosis - Plan: doxycycline (VIBRA-TABS) 100 MG tablet, clindamycin (CLINDAGEL) 1 % gel  Breast cancer screening - Plan: MM 3D SCREEN BREAST BILATERAL  Conjunctivitis, allergic, left - Plan: Olopatadine HCl (PAZEO) 0.7 % SOLN  Right ankle pain, unspecified chronicity  If ankle pain has not resolved in 3 weeks, please arrange appointment with podiatrist.  Take antibiotic with food. Avoid scratching lesions.  Please be sure medication list is accurate. If a new problem present, please set up appointment sooner than planned today.
